# Patient Record
Sex: Female | Born: 1965
Health system: Southern US, Community
[De-identification: ages and names within clinical notes are randomized; demographics above are authoritative.]

## PROBLEM LIST (undated history)

## (undated) DIAGNOSIS — I10 Essential (primary) hypertension: Secondary | ICD-10-CM

## (undated) DIAGNOSIS — E785 Hyperlipidemia, unspecified: Secondary | ICD-10-CM

## (undated) DIAGNOSIS — T8859XA Other complications of anesthesia, initial encounter: Secondary | ICD-10-CM

## (undated) DIAGNOSIS — F329 Major depressive disorder, single episode, unspecified: Secondary | ICD-10-CM

## (undated) DIAGNOSIS — F419 Anxiety disorder, unspecified: Secondary | ICD-10-CM

## (undated) DIAGNOSIS — G4730 Sleep apnea, unspecified: Secondary | ICD-10-CM

## (undated) DIAGNOSIS — F32A Depression, unspecified: Secondary | ICD-10-CM

## (undated) HISTORY — PX: DILATION AND CURETTAGE OF UTERUS: SHX78

## (undated) HISTORY — PX: APPENDECTOMY: SHX54

## (undated) HISTORY — PX: CARPECTOMY HAND: SUR194

---

## 2004-02-24 ENCOUNTER — Ambulatory Visit: Payer: Self-pay | Admitting: Obstetrics and Gynecology

## 2004-02-27 ENCOUNTER — Ambulatory Visit: Payer: Self-pay | Admitting: Obstetrics and Gynecology

## 2004-03-12 ENCOUNTER — Ambulatory Visit: Payer: Self-pay | Admitting: Obstetrics and Gynecology

## 2004-03-20 ENCOUNTER — Ambulatory Visit: Payer: Self-pay | Admitting: Obstetrics and Gynecology

## 2004-03-28 ENCOUNTER — Ambulatory Visit: Payer: Self-pay | Admitting: Obstetrics and Gynecology

## 2004-03-30 ENCOUNTER — Ambulatory Visit: Payer: Self-pay | Admitting: Obstetrics and Gynecology

## 2004-04-02 ENCOUNTER — Ambulatory Visit: Payer: Self-pay | Admitting: Obstetrics and Gynecology

## 2004-07-04 ENCOUNTER — Emergency Department: Payer: Self-pay | Admitting: Emergency Medicine

## 2004-07-05 ENCOUNTER — Emergency Department: Payer: Self-pay | Admitting: Emergency Medicine

## 2005-05-06 ENCOUNTER — Ambulatory Visit: Payer: Self-pay | Admitting: Obstetrics and Gynecology

## 2006-06-12 ENCOUNTER — Ambulatory Visit: Payer: Self-pay | Admitting: Obstetrics and Gynecology

## 2006-06-18 ENCOUNTER — Ambulatory Visit: Payer: Self-pay | Admitting: Obstetrics and Gynecology

## 2006-10-27 ENCOUNTER — Ambulatory Visit: Payer: Self-pay | Admitting: Emergency Medicine

## 2007-06-11 ENCOUNTER — Ambulatory Visit: Payer: Self-pay | Admitting: Obstetrics and Gynecology

## 2007-06-11 ENCOUNTER — Ambulatory Visit: Payer: Self-pay | Admitting: Internal Medicine

## 2007-06-18 ENCOUNTER — Ambulatory Visit: Payer: Self-pay | Admitting: Obstetrics and Gynecology

## 2007-07-22 ENCOUNTER — Ambulatory Visit: Payer: Self-pay | Admitting: Internal Medicine

## 2007-07-28 ENCOUNTER — Ambulatory Visit: Payer: Self-pay | Admitting: Internal Medicine

## 2007-09-10 ENCOUNTER — Ambulatory Visit: Payer: Self-pay | Admitting: Internal Medicine

## 2007-09-28 ENCOUNTER — Ambulatory Visit: Payer: Self-pay | Admitting: Internal Medicine

## 2007-10-21 ENCOUNTER — Ambulatory Visit: Payer: Self-pay | Admitting: Allergy

## 2008-06-20 ENCOUNTER — Ambulatory Visit: Payer: Self-pay | Admitting: Obstetrics and Gynecology

## 2008-06-28 ENCOUNTER — Ambulatory Visit: Payer: Self-pay | Admitting: Obstetrics and Gynecology

## 2008-07-14 ENCOUNTER — Ambulatory Visit: Payer: Self-pay | Admitting: Internal Medicine

## 2008-08-05 ENCOUNTER — Other Ambulatory Visit: Payer: Self-pay | Admitting: Internal Medicine

## 2009-01-03 ENCOUNTER — Ambulatory Visit: Payer: Self-pay | Admitting: Obstetrics and Gynecology

## 2009-03-09 ENCOUNTER — Ambulatory Visit: Payer: Self-pay | Admitting: Internal Medicine

## 2009-03-09 ENCOUNTER — Ambulatory Visit: Payer: Self-pay | Admitting: Orthopedic Surgery

## 2009-03-14 ENCOUNTER — Ambulatory Visit: Payer: Self-pay | Admitting: Orthopedic Surgery

## 2009-03-29 ENCOUNTER — Ambulatory Visit: Payer: Self-pay | Admitting: Family Medicine

## 2009-06-28 ENCOUNTER — Ambulatory Visit: Payer: Self-pay | Admitting: Obstetrics and Gynecology

## 2009-10-03 ENCOUNTER — Other Ambulatory Visit: Payer: Self-pay | Admitting: Obstetrics and Gynecology

## 2010-09-24 ENCOUNTER — Ambulatory Visit: Payer: Self-pay | Admitting: Obstetrics and Gynecology

## 2011-09-25 ENCOUNTER — Ambulatory Visit: Payer: Self-pay | Admitting: Obstetrics and Gynecology

## 2016-10-23 ENCOUNTER — Ambulatory Visit
Admission: EM | Admit: 2016-10-23 | Discharge: 2016-10-23 | Disposition: A | Discharge status: Home / Self Care | Payer: Medicare Other | Attending: Family Medicine | Admitting: Family Medicine

## 2016-10-23 DIAGNOSIS — L03115 Cellulitis of right lower limb: Secondary | ICD-10-CM

## 2016-10-23 MED ORDER — CEFAZOLIN SODIUM 1 G IJ SOLR
1.0000 g | Freq: Once | INTRAMUSCULAR | Status: AC
  Administered 2016-10-23: 1 g via INTRAMUSCULAR

## 2016-10-23 MED ORDER — SULFAMETHOXAZOLE-TRIMETHOPRIM 800-160 MG PO TABS: 1.0000 | ORAL_TABLET | Freq: Two times a day (BID) | ORAL | 0 refills | Status: DC

## 2016-10-23 NOTE — ED Triage Notes (Signed)
Pt reports getting bit by unknown insect last week that caused itchy bump to right ankle. After several days the itching subsided. Monday pt was on feet all day and noticed increased swelling, redness, and drainage from site last night

## 2016-10-23 NOTE — ED Provider Notes (Addendum)
[facility]    CSN: [id number] Arrival date & time: 10/23/16  1558     History   Chief Complaint Chief Complaint  Patient presents with  . Wound Infection    HPI Alicia Rowe is a 51 y.o. female.   51 yo female with a c/o right foot redness, drainage, warmth and pain to to skin just above the ankle joint. States she thinks that she may have been bitten by an insect last week when she noticed a "little bump" on that area. Denies any fevers or chills.    The history is provided by the patient.    History reviewed. No pertinent past medical history.  There are no active problems to display for this patient.   Past Surgical History:  Procedure Laterality Date  . APPENDECTOMY    . CARPECTOMY HAND    . DILATION AND CURETTAGE OF UTERUS      OB History    No data available       Home Medications    Prior to Admission medications   Medication Sig Start Date End Date Taking? Authorizing Provider  sulfamethoxazole-trimethoprim (BACTRIM DS,SEPTRA DS) 800-160 MG tablet Take 1 tablet by mouth 2 (two) times daily. 10/23/16   Norval Gable, MD    Family History No family history on file.  Social History Social History  Substance Use Topics  . Smoking status: Current Every Day Smoker  . Smokeless tobacco: Never Used  . Alcohol use No     Allergies   Patient has no known allergies.   Review of Systems Review of Systems   Physical Exam Triage Vital Signs ED Triage Vitals  Enc Vitals Group     BP 10/23/16 1637 (!) 153/81     Pulse Rate 10/23/16 1637 76     Resp 10/23/16 1637 16     Temp 10/23/16 1637 98.5 F (36.9 C)     Temp Source 10/23/16 1637 Oral     SpO2 10/23/16 1637 99 %     Weight 10/23/16 1637 214 lb 15.2 oz (97.5 kg)     Height 10/23/16 1637 5\' 3"  (1.6 m)     Head Circumference --      Peak Flow --      Pain Score 10/23/16 1642 8     Pain Loc --      Pain Edu? --      Excl. in [place]? --    No data found.   Updated Vital  Signs BP (!) 153/81 (BP Location: Right Arm)   Pulse 76   Temp 98.5 F (36.9 C) (Oral)   Resp 16   Ht 5\' 3"  (1.6 m)   Wt 214 lb 15.2 oz (97.5 kg)   SpO2 99%   BMI 38.08 kg/m   Visual Acuity Right Eye Distance:   Left Eye Distance:   Bilateral Distance:    Right Eye Near:   Left Eye Near:    Bilateral Near:     Physical Exam  Constitutional: She appears well-developed and well-nourished. No distress.  Musculoskeletal:       Right ankle: She exhibits swelling. She exhibits normal range of motion, no ecchymosis, no deformity, no laceration and normal pulse. Achilles tendon normal.       Feet:  Blanchable erythema, edema, warmth and tenderness to palpation on the skin just above the lateral malleolus; pinpoint puncture wound noted  Skin: She is not diaphoretic. There is erythema.  Nursing note and vitals reviewed.  UC Treatments / Results  Labs (all labs ordered are listed, but only abnormal results are displayed) Labs Reviewed - No data to display  EKG  EKG Interpretation None       Radiology No results found.  Procedures Procedures (including critical care time)  Medications Ordered in UC Medications  ceFAZolin (ANCEF) injection 1 g (1 g Intramuscular Given 10/23/16 1723)     Initial Impression / Assessment and Plan / UC Course  I have reviewed the triage vital signs and the nursing notes.  Pertinent labs & imaging results that were available during my care of the patient were reviewed by me and considered in my medical decision making (see chart for details).       Final Clinical Impressions(s) / UC Diagnoses   Final diagnoses:  Cellulitis of right foot    New Prescriptions Discharge Medication List as of 10/23/2016  5:37 PM    START taking these medications   Details  sulfamethoxazole-trimethoprim (BACTRIM DS,SEPTRA DS) 800-160 MG tablet Take 1 tablet by mouth 2 (two) times daily., Starting Wed 10/23/2016, Normal       1. diagnosis  reviewed with patient  2. Patient given Ancef 1gm IM x1 3. rx as per orders above; reviewed possible side effects, interactions, risks and benefits  4. Recommend supportive treatment with elevation, warm compresses 5. Follow-up prn if symptoms worsen or don't improve  Controlled Substance Prescriptions [place] Controlled Substance Registry consulted? Not Applicable   Norval Gable, MD 10/23/16 [place], MD 10/23/16 [phone]

## 2016-12-12 DIAGNOSIS — Z23 Encounter for immunization: Secondary | ICD-10-CM | POA: Diagnosis not present

## 2017-01-12 ENCOUNTER — Ambulatory Visit
Admission: EM | Admit: 2017-01-12 | Discharge: 2017-01-12 | Disposition: A | Discharge status: Home / Self Care | Payer: Medicare Other | Attending: Family Medicine | Admitting: Family Medicine

## 2017-01-12 ENCOUNTER — Encounter: Payer: Self-pay | Admitting: *Deleted

## 2017-01-12 ENCOUNTER — Other Ambulatory Visit: Payer: Self-pay

## 2017-01-12 DIAGNOSIS — L02412 Cutaneous abscess of left axilla: Secondary | ICD-10-CM | POA: Diagnosis not present

## 2017-01-12 HISTORY — DX: Major depressive disorder, single episode, unspecified: F32.9

## 2017-01-12 HISTORY — DX: Depression, unspecified: F32.A

## 2017-01-12 HISTORY — DX: Anxiety disorder, unspecified: F41.9

## 2017-01-12 MED ORDER — DOXYCYCLINE HYCLATE 100 MG PO CAPS: 100.0000 mg | ORAL_CAPSULE | Freq: Two times a day (BID) | ORAL | 0 refills | Status: DC

## 2017-01-12 NOTE — ED Provider Notes (Signed)
[facility]    CSN: [id number] Arrival date & time: 01/12/17  1413  History   Chief Complaint Chief Complaint  Patient presents with  . Abscess   HPI  51 year old female presents with abscess.  Patient states that over the past 2 weeks she has had 2 abscesses in her left axilla.  She reports a prior history of frequent abscesses in the axilla.  She states that over the past 3-4 days, 1 of them has been more severe and worsening. Drained the pus yesterday.  She has been applying warm compresses without complete resolution.  No medications tried.  She states that she has had no fever but has had diarrhea and loss of appetite.  She states that the areas of concern are red and hard to the touch.  Tender.  No other associated symptoms.  No other complaints at this time.  Past Medical History:  Diagnosis Date  . Anxiety   . Depression    Past Surgical History:  Procedure Laterality Date  . APPENDECTOMY    . CARPECTOMY HAND    . DILATION AND CURETTAGE OF UTERUS     OB History    No data available     Home Medications    Prior to Admission medications   Medication Sig Start Date End Date Taking? Authorizing Provider  ALPRAZolam Duanne Moron) 1 MG tablet Take 1 mg by mouth at bedtime as needed for anxiety.   Yes [provider]  FLUoxetine (PROZAC) 20 MG tablet Take 20 mg by mouth daily.   Yes [provider]  naproxen sodium (ALEVE) 220 MG tablet Take 440 mg by mouth daily as needed.   Yes [provider]  doxycycline (VIBRAMYCIN) 100 MG capsule Take 1 capsule (100 mg total) by mouth 2 (two) times daily. 01/12/17   Coral Spikes, DO   Family History Family History  Problem Relation Age of Onset  . Dementia Mother    Social History Social History   Tobacco Use  . Smoking status: Current Every Day Smoker  . Smokeless tobacco: Never Used  Substance Use Topics  . Alcohol use: No  . Drug use: No    Allergies   Patient has no known  allergies.  Review of Systems Review of Systems  Constitutional: Positive for appetite change. Negative for fever.  Gastrointestinal: Positive for diarrhea.  Skin:       Abscess. Drainage. (Left axilla).   Physical Exam Triage Vital Signs ED Triage Vitals  Enc Vitals Group     BP 01/12/17 1532 (!) 153/57     Pulse Rate 01/12/17 1532 79     Resp 01/12/17 1532 16     Temp 01/12/17 1532 99 F (37.2 C)     Temp Source 01/12/17 1532 Oral     SpO2 01/12/17 1532 99 %     Weight 01/12/17 1534 204 lb (92.5 kg)     Height 01/12/17 1534 5\' 3"  (1.6 m)     Head Circumference --      Peak Flow --      Pain Score 01/12/17 1534 5     Pain Loc --      Pain Edu? --      Excl. in [place]? --    No data found.  Updated Vital Signs BP (!) 153/57 (BP Location: Left Arm)   Pulse 79   Temp 99 F (37.2 C) (Oral)   Resp 16   Ht 5\' 3"  (1.6 m)   Wt 204  lb (92.5 kg)   SpO2 99%   BMI 36.14 kg/m     Physical Exam  Constitutional: She is oriented to person, place, and time. She appears well-developed. No distress.  Cardiovascular: Normal rate and regular rhythm.  Pulmonary/Chest: Effort normal and breath sounds normal. She has no wheezes. She has no rales.  Neurological: She is alert and oriented to person, place, and time.  Skin:  Left axilla - 2 discrete abscess noted.  Superior abscess measures 3.5 cm x 7 mm.  Firm.  No fluctuance.  Inferior abscess measures 1.5 cm circumferentially.  Firm.  No fluctuance.  Both areas are erythematous.  Superior abscess tender to palpation.  Psychiatric: She has a normal mood and affect. Her behavior is normal.  Nursing note and vitals reviewed.  UC Treatments / Results  Labs (all labs ordered are listed, but only abnormal results are displayed) Labs Reviewed - No data to display  EKG  EKG Interpretation None       Radiology No results found.  Procedures Procedures (including critical care time)  Medications Ordered in UC Medications - No data  to display   Initial Impression / Assessment and Plan / UC Course  I have reviewed the triage vital signs and the nursing notes.  Pertinent labs & imaging results that were available during my care of the patient were reviewed by me and considered in my medical decision making (see chart for details).     51 year old female presents with abscesses in the axilla.  I did not feel that either were amenable to drainage.  Placing on doxycycline.  Final Clinical Impressions(s) / UC Diagnoses   Final diagnoses:  Abscess of left axilla    ED Discharge Orders        Ordered    doxycycline (VIBRAMYCIN) 100 MG capsule  2 times daily     01/12/17 1624     Controlled Substance Prescriptions [place] Controlled Substance Registry consulted? Not Applicable   Coral Spikes, DO 01/12/17 1718

## 2017-01-12 NOTE — Discharge Instructions (Signed)
Antibiotic as prescribed.  Take care  Dr. Gae Bihl  

## 2017-01-12 NOTE — ED Triage Notes (Signed)
Patient started having symptoms of diarrhea, loss of appetite, and abscess of the left armpit 3 days ago. Patient has a history of armpit abscess.

## 2017-03-02 ENCOUNTER — Other Ambulatory Visit: Payer: Self-pay | Admitting: Family Medicine

## 2017-03-27 ENCOUNTER — Ambulatory Visit
Admission: EM | Admit: 2017-03-27 | Discharge: 2017-03-27 | Disposition: A | Discharge status: Home / Self Care | Payer: Medicare HMO | Attending: Family Medicine | Admitting: Family Medicine

## 2017-03-27 ENCOUNTER — Encounter: Payer: Self-pay | Admitting: *Deleted

## 2017-03-27 DIAGNOSIS — J3489 Other specified disorders of nose and nasal sinuses: Secondary | ICD-10-CM

## 2017-03-27 DIAGNOSIS — J0100 Acute maxillary sinusitis, unspecified: Secondary | ICD-10-CM

## 2017-03-27 MED ORDER — MUPIROCIN 2 % EX OINT: 1.0000 "application " | TOPICAL_OINTMENT | Freq: Two times a day (BID) | CUTANEOUS | 0 refills | Status: AC

## 2017-03-27 MED ORDER — DOXYCYCLINE HYCLATE 100 MG PO CAPS: 100.0000 mg | ORAL_CAPSULE | Freq: Two times a day (BID) | ORAL | 0 refills | Status: DC

## 2017-03-27 NOTE — ED Provider Notes (Signed)
[facility]    CSN: [id number] Arrival date & time: 03/27/17  1139  History   Chief Complaint Chief Complaint  Patient presents with  . Headache  . Facial Pain  . Facial Swelling   HPI  52 year old female presents with the above complaints.  Patient reports a 3-week history of facial pain, congestion, and sinus pressure.  Sinus pain and pressure is predominantly located on the left side, maxillary region as well as around the eye.  Severe.  She has had some subjective fever recently.  She states that her symptoms have been worsening and have not improved with over-the-counter Flonase as well as over-the-counter cold medications.  Additionally, patient reports swelling of the left nostril and left side of the nose.  No known exacerbating factors.  No other associated symptoms.  No other complaints at this time.  Past Medical History:  Diagnosis Date  . Anxiety   . Depression    Past Surgical History:  Procedure Laterality Date  . APPENDECTOMY    . CARPECTOMY HAND    . DILATION AND CURETTAGE OF UTERUS      OB History    No data available     Home Medications    Prior to Admission medications   Medication Sig Start Date End Date Taking? Authorizing Provider  ALPRAZolam Duanne Moron) 1 MG tablet Take 1 mg by mouth at bedtime as needed for anxiety.   Yes [provider]  FLUoxetine (PROZAC) 20 MG tablet Take 20 mg by mouth daily.   Yes [provider]  naproxen sodium (ALEVE) 220 MG tablet Take 440 mg by mouth daily as needed.   Yes [provider]  doxycycline (VIBRAMYCIN) 100 MG capsule Take 1 capsule (100 mg total) by mouth 2 (two) times daily. 03/27/17   Coral Spikes, DO  mupirocin ointment (BACTROBAN) 2 % Apply 1 application topically 2 (two) times daily for 7 days. 03/27/17 04/03/17  Coral Spikes, DO    Family History Family History  Problem Relation Age of Onset  . Dementia Mother   . Diabetes Father     Social History Social  History   Tobacco Use  . Smoking status: Current Every Day Smoker  . Smokeless tobacco: Never Used  Substance Use Topics  . Alcohol use: No  . Drug use: No     Allergies   Patient has no known allergies.   Review of Systems Review of Systems  Constitutional: Positive for fever.  HENT: Positive for congestion, sinus pressure and sinus pain.    Physical Exam Triage Vital Signs ED Triage Vitals  Enc Vitals Group     BP 03/27/17 1159 (!) 153/86     Pulse Rate 03/27/17 1159 85     Resp 03/27/17 1159 16     Temp 03/27/17 1159 98.6 F (37 C)     Temp Source 03/27/17 1159 Oral     SpO2 03/27/17 1159 98 %     Weight 03/27/17 1204 205 lb (93 kg)     Height 03/27/17 1204 5\' 3"  (1.6 m)     Head Circumference --      Peak Flow --      Pain Score 03/27/17 1203 6     Pain Loc --      Pain Edu? --      Excl. in [place]? --    Updated Vital Signs BP (!) 153/86 (BP Location: Left Arm)   Pulse 85   Temp 98.6 F (37 C) (Oral)  Resp 16   Ht 5\' 3"  (1.6 m)   Wt 205 lb (93 kg)   SpO2 98%   BMI 36.31 kg/m   Visual Acuity Right Eye Distance: 20/30 Left Eye Distance: 20/25 Bilateral Distance: 20/20  Right Eye Near:   Left Eye Near:    Bilateral Near:     Physical Exam  Constitutional: She is oriented to person, place, and time. She appears well-developed and well-nourished. No distress.  HENT:  Head: Normocephalic and atraumatic.  Left maxillary sinus tenderness to palpation.  Erythema and mild swelling noted around the left nasal vestibule and left nostril.  Eyes: Conjunctivae are normal. Right eye exhibits no discharge. Left eye exhibits no discharge.  Cardiovascular: Normal rate and regular rhythm.  Pulmonary/Chest: Effort normal and breath sounds normal. She has no wheezes. She has no rales.  Neurological: She is alert and oriented to person, place, and time.  Psychiatric: She has a normal mood and affect. Her behavior is normal.  Nursing note and vitals reviewed.  UC  Treatments / Results  Labs (all labs ordered are listed, but only abnormal results are displayed) Labs Reviewed - No data to display  EKG  EKG Interpretation None       Radiology No results found.  Procedures Procedures (including critical care time)  Medications Ordered in UC Medications - No data to display   Initial Impression / Assessment and Plan / UC Course  I have reviewed the triage vital signs and the nursing notes.  Pertinent labs & imaging results that were available during my care of the patient were reviewed by me and considered in my medical decision making (see chart for details).     52 year old female presents with sinusitis and nasal vestibulitis.  Treating with Doxy and Bactrim.  Final Clinical Impressions(s) / UC Diagnoses   Final diagnoses:  Acute maxillary sinusitis, recurrence not specified  Nasal vestibulitis    ED Discharge Orders        Ordered    doxycycline (VIBRAMYCIN) 100 MG capsule  2 times daily     03/27/17 1238    mupirocin ointment (BACTROBAN) 2 %  2 times daily     03/27/17 1238     Controlled Substance Prescriptions [place] Controlled Substance Registry consulted? Not Applicable   Coral Spikes, DO 03/27/17 1321

## 2017-03-27 NOTE — Discharge Instructions (Signed)
Meds as prescribed.  Take care and I hope you feel better  Dr. Lacinda Axon

## 2017-03-27 NOTE — ED Triage Notes (Signed)
Headache, facial pain and edema, nasal pain and edema since Sunday.

## 2017-06-16 ENCOUNTER — Other Ambulatory Visit: Payer: Self-pay | Admitting: Obstetrics & Gynecology

## 2017-06-16 DIAGNOSIS — N6310 Unspecified lump in the right breast, unspecified quadrant: Principal | ICD-10-CM

## 2017-06-16 DIAGNOSIS — N6315 Unspecified lump in the right breast, overlapping quadrants: Secondary | ICD-10-CM

## 2017-06-19 ENCOUNTER — Other Ambulatory Visit: Payer: Self-pay | Admitting: Internal Medicine

## 2017-06-19 DIAGNOSIS — R945 Abnormal results of liver function studies: Secondary | ICD-10-CM

## 2017-06-23 ENCOUNTER — Ambulatory Visit
Admission: RE | Admit: 2017-06-23 | Discharge: 2017-06-23 | Disposition: A | Discharge status: Home / Self Care | Payer: Medicare HMO | Source: Ambulatory Visit | Attending: Obstetrics & Gynecology | Admitting: Obstetrics & Gynecology

## 2017-06-23 DIAGNOSIS — N6310 Unspecified lump in the right breast, unspecified quadrant: Secondary | ICD-10-CM | POA: Insufficient documentation

## 2017-06-23 DIAGNOSIS — N6315 Unspecified lump in the right breast, overlapping quadrants: Secondary | ICD-10-CM

## 2017-06-24 ENCOUNTER — Other Ambulatory Visit: Admit: 2017-06-24 | Payer: Medicare Other

## 2017-06-24 ENCOUNTER — Ambulatory Visit
Admission: RE | Admit: 2017-06-24 | Discharge: 2017-06-24 | Disposition: A | Discharge status: Home / Self Care | Payer: Medicare HMO | Source: Ambulatory Visit | Attending: Internal Medicine | Admitting: Internal Medicine

## 2017-06-24 DIAGNOSIS — R945 Abnormal results of liver function studies: Secondary | ICD-10-CM | POA: Diagnosis not present

## 2017-07-03 ENCOUNTER — Encounter: Payer: Self-pay | Admitting: *Deleted

## 2019-01-13 IMAGING — US US ABDOMEN COMPLETE
1 series · 13 of 25 positions shown · non-contrast
Comparison: None.

CLINICAL DATA: 51-year-old female with abnormal liver function
studies. Initial encounter.

EXAM:
ABDOMEN ULTRASOUND COMPLETE

[Series 1: us abdomen complete · 0.23mm/px · 13 of 94 slices shown]
[im 1/94]
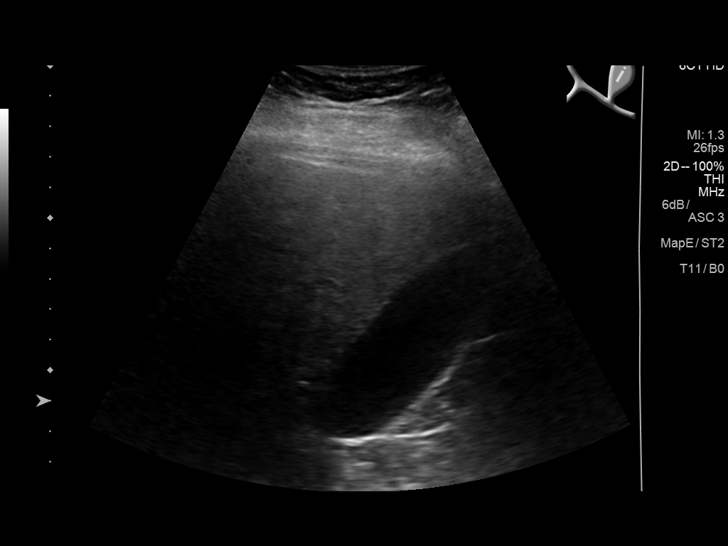
[im 8/94]
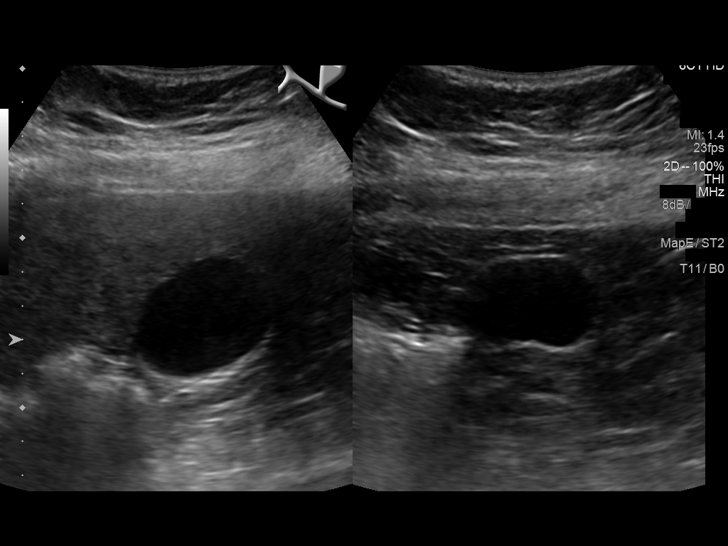
[im 16/94]
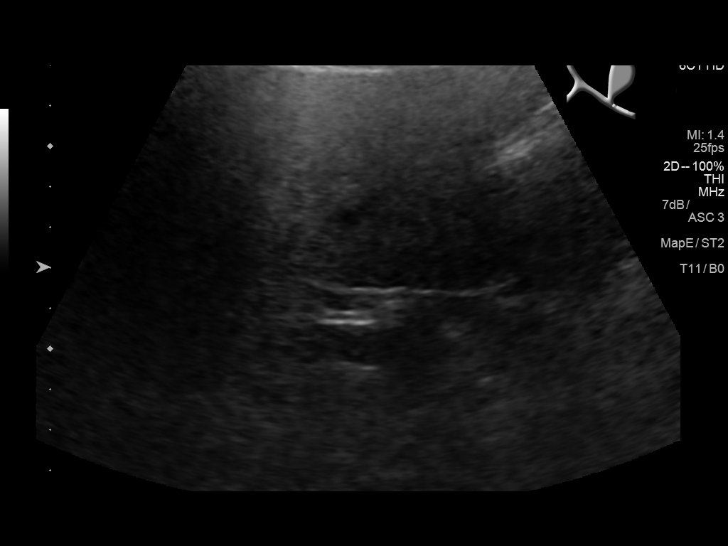
[im 24/94]
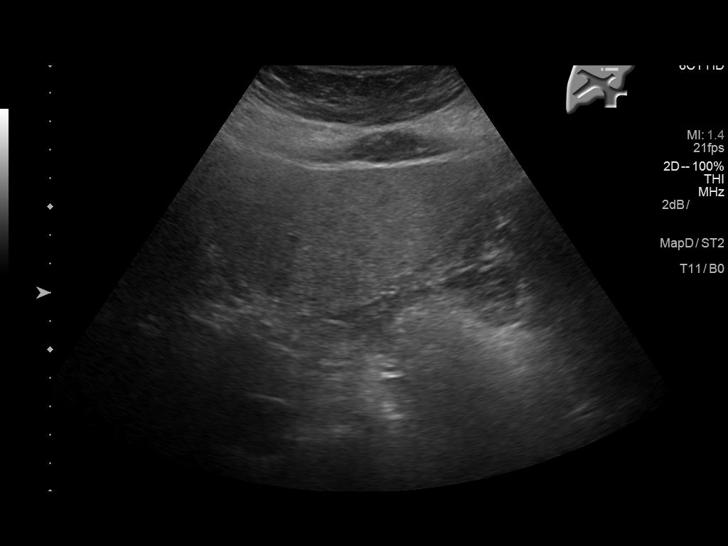
[im 32/94]
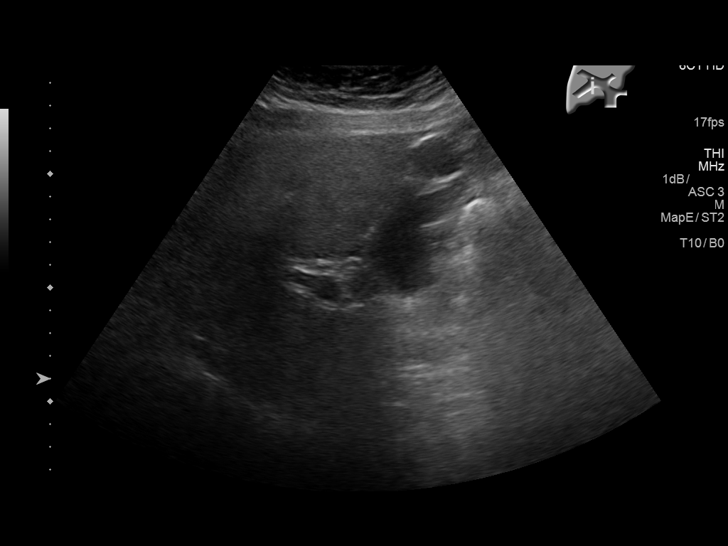
[im 39/94]
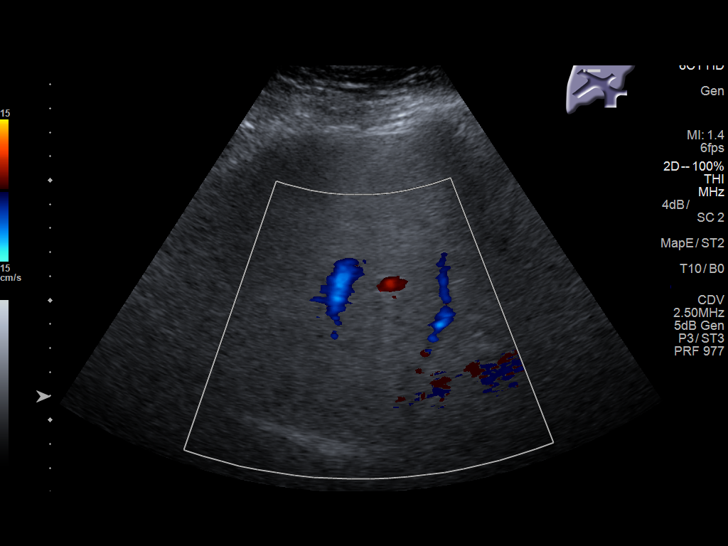
[im 47/94]
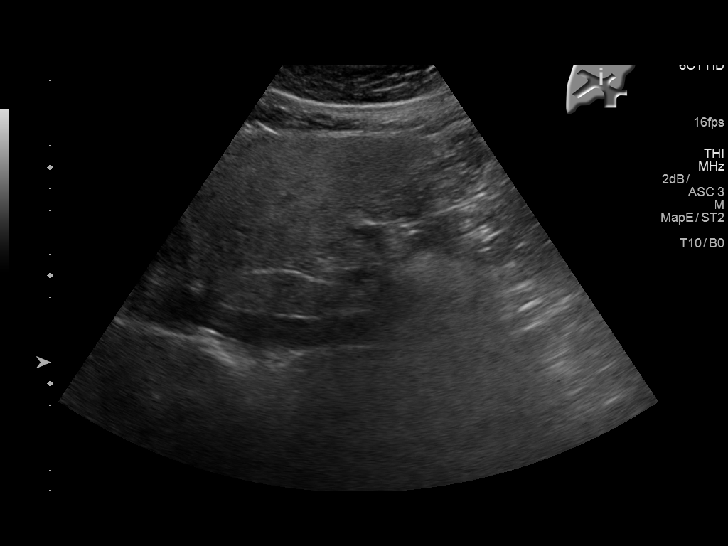
[im 55/94]
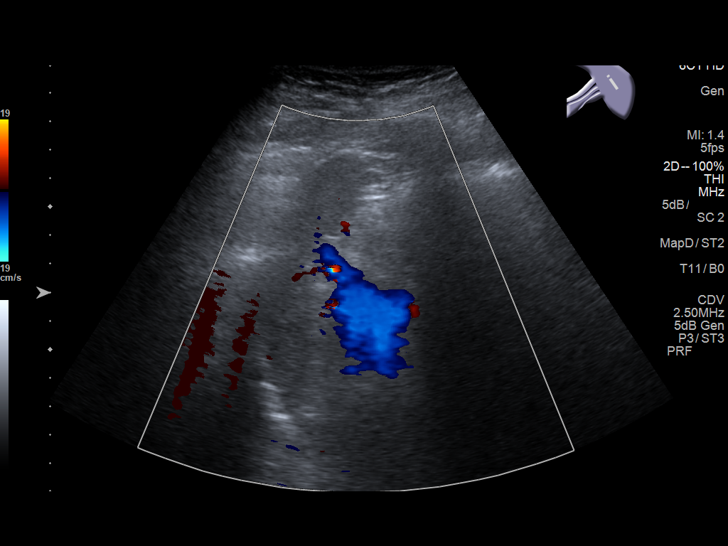
[im 63/94]
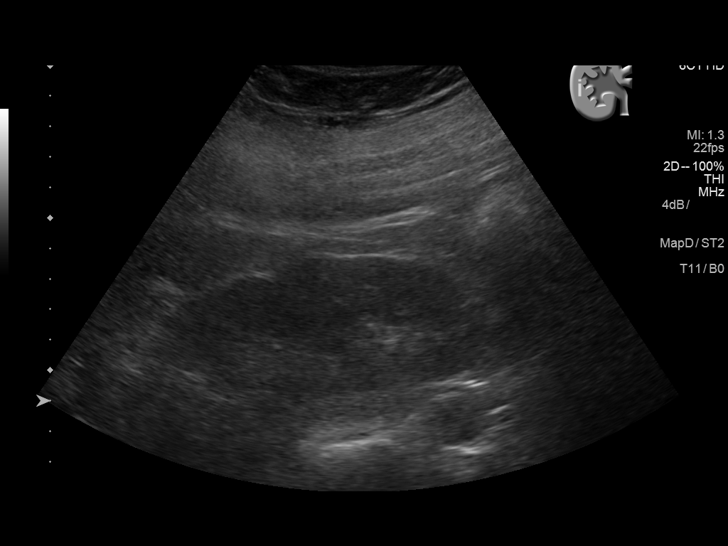
[im 70/94]
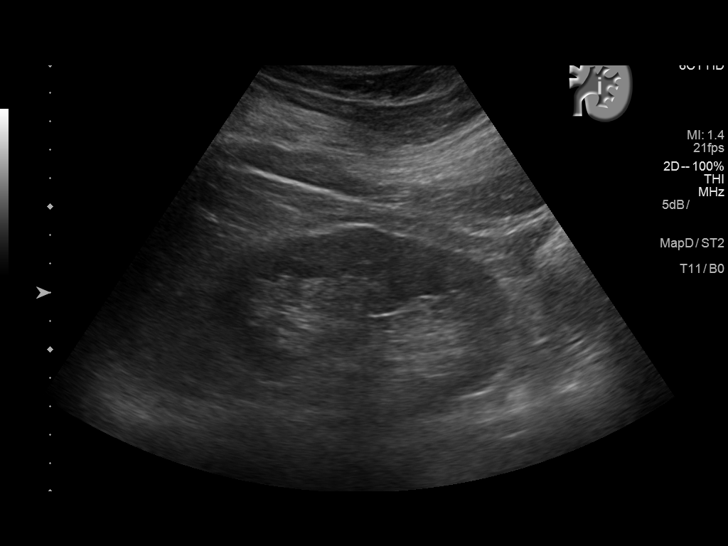
[im 78/94]
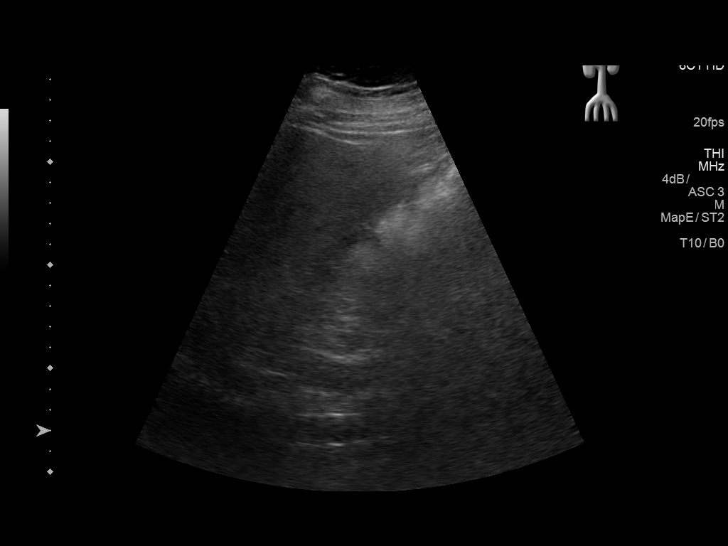
[im 86/94]
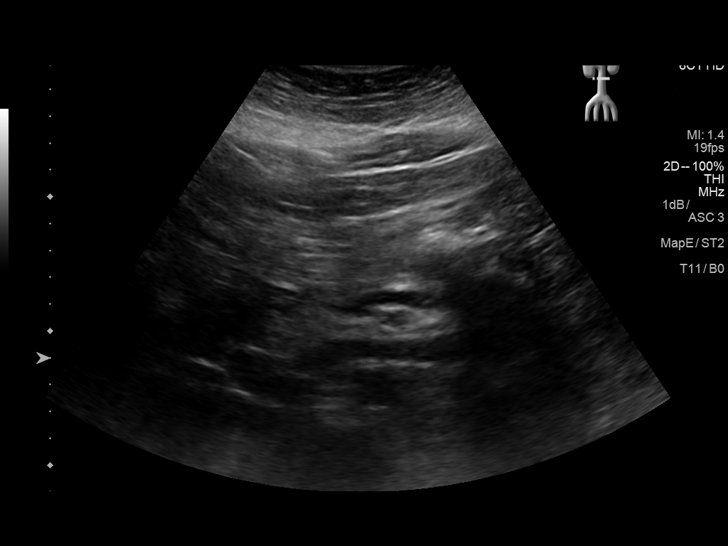
[im 94/94]
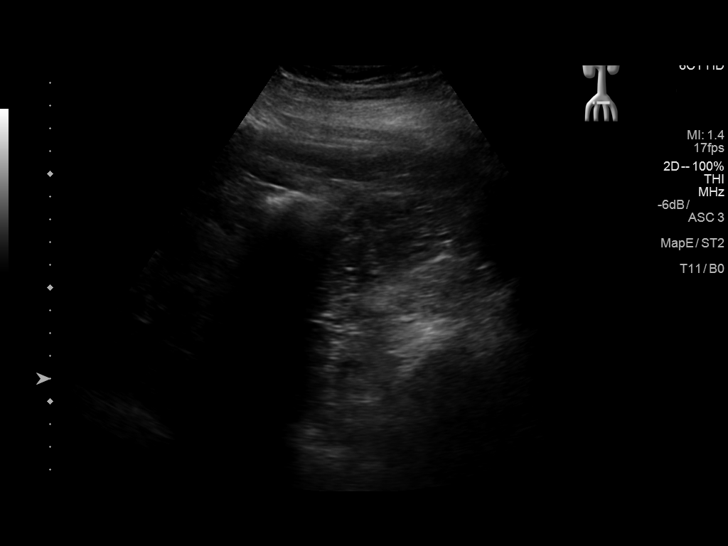

[13 of 25 positions shown; findings below may reference images not displayed]

FINDINGS: Gallbladder: No gallstones or wall thickening visualized. No
sonographic Murphy sign noted by sonographer.

Common bile duct: Diameter: 4.6 mm

Liver: Liver of increased echogenicity consistent with fatty
infiltration and/or hepatocellular disease. Difficult to penetrate
the liver. No focal mass noted. Portal vein is patent on color
Doppler imaging with normal direction of blood flow towards the
liver.

IVC: Suboptimally evaluated secondary to habitus and bowel gas.
Portions visualized unremarkable.

Pancreas: Suboptimally evaluated secondary to habitus and bowel gas.
Portions visualized unremarkable.

Spleen: Size and appearance within normal limits.

Right Kidney: Length: 10.5 cm. Echogenicity within normal limits. No
mass or hydronephrosis visualized.

Left Kidney: Length: 10.3 cm. Echogenicity within normal limits. No
mass or hydronephrosis visualized.

Abdominal aorta: Suboptimally evaluated secondary to habitus and
bowel gas. Portions visualized unremarkable.

Other findings: None.
IMPRESSION: Liver of increased echogenicity consistent with fatty infiltration
and/or hepatocellular disease. It was difficult to penetrate the
liver. No focal hepatic lesion noted.

Suboptimal evaluation of the inferior vena cava, pancreas and aorta
secondary to bowel gas. The portions which are visualized are
unremarkable.

Otherwise negative abdominal sonogram. Specifically, gallbladder is
within normal limits.

## 2019-01-21 ENCOUNTER — Other Ambulatory Visit: Payer: Self-pay | Admitting: Internal Medicine

## 2019-01-21 DIAGNOSIS — Z1231 Encounter for screening mammogram for malignant neoplasm of breast: Secondary | ICD-10-CM

## 2019-01-27 ENCOUNTER — Telehealth: Payer: Self-pay

## 2019-01-27 ENCOUNTER — Other Ambulatory Visit: Payer: Self-pay

## 2019-01-27 DIAGNOSIS — Z1211 Encounter for screening for malignant neoplasm of colon: Secondary | ICD-10-CM

## 2019-01-27 NOTE — Telephone Encounter (Signed)
Gastroenterology Pre-Procedure Review  Request Date: Thursday 02/18/19 Requesting Physician: Dr. Allen Norris  PATIENT REVIEW QUESTIONS: The patient responded to the following health history questions as indicated:    1. Are you having any GI issues? no 2. Do you have a personal history of Polyps? no 3. Do you have a family history of Colon Cancer or Polyps? no 4. Diabetes Mellitus? no 5. Joint replacements in the past 12 months?no 6. Major health problems in the past 3 months?no 7. Any artificial heart valves, MVP, or defibrillator?no    MEDICATIONS & ALLERGIES:    Patient reports the following regarding taking any anticoagulation/antiplatelet therapy:   Plavix, Coumadin, Eliquis, Xarelto, Lovenox, Pradaxa, Brilinta, or Effient? no Aspirin? no  Patient confirms/reports the following medications:  Current Outpatient Medications  Medication Sig Dispense Refill  . ALPRAZolam (XANAX) 1 MG tablet Take 1 mg by mouth at bedtime as needed for anxiety.    Marland Kitchen doxycycline (VIBRAMYCIN) 100 MG capsule Take 1 capsule (100 mg total) by mouth 2 (two) times daily. 14 capsule 0  . FLUoxetine (PROZAC) 20 MG tablet Take 20 mg by mouth daily.    . naproxen sodium (ALEVE) 220 MG tablet Take 440 mg by mouth daily as needed.     No current facility-administered medications for this visit.    Patient confirms/reports the following allergies:  Allergies  Allergen Reactions  . Codeine Hives  . Midazolam Other (See Comments)    "reverse effect" pt states that she "woke up" when given "reverse effect" pt states that she "woke up" when given   . Scopolamine     Other reaction(s): Hallucination    No orders of the defined types were placed in this encounter.   AUTHORIZATION INFORMATION Primary Insurance: 1D#: Group #:  Secondary Insurance: 1D#: Group #:  SCHEDULE INFORMATION: Date: 02/18/19 Time: Location:[facility]

## 2019-02-16 ENCOUNTER — Other Ambulatory Visit
Admission: RE | Admit: 2019-02-16 | Discharge: 2019-02-16 | Disposition: A | Discharge status: Home / Self Care | Payer: Medicare HMO | Source: Ambulatory Visit | Attending: Gastroenterology | Admitting: Gastroenterology

## 2019-02-16 ENCOUNTER — Other Ambulatory Visit: Payer: Self-pay

## 2019-02-16 DIAGNOSIS — Z20822 Contact with and (suspected) exposure to covid-19: Secondary | ICD-10-CM | POA: Insufficient documentation

## 2019-02-16 DIAGNOSIS — Z01812 Encounter for preprocedural laboratory examination: Secondary | ICD-10-CM | POA: Diagnosis present

## 2019-02-17 LAB — SARS CORONAVIRUS 2 (TAT 6-24 HRS): SARS Coronavirus 2: NEGATIVE

## 2019-02-18 ENCOUNTER — Ambulatory Visit: Payer: Medicare HMO | Admitting: Certified Registered"

## 2019-02-18 ENCOUNTER — Encounter: Payer: Self-pay | Admitting: Gastroenterology

## 2019-02-18 ENCOUNTER — Encounter
Admission: RE | Disposition: A | Discharge status: Home / Self Care | Payer: Self-pay | Source: Ambulatory Visit | Attending: Gastroenterology

## 2019-02-18 ENCOUNTER — Ambulatory Visit
Admission: RE | Admit: 2019-02-18 | Discharge: 2019-02-18 | Disposition: A | Discharge status: Home / Self Care | Payer: Medicare HMO | Source: Ambulatory Visit | Attending: Gastroenterology | Admitting: Gastroenterology

## 2019-02-18 DIAGNOSIS — K640 First degree hemorrhoids: Secondary | ICD-10-CM | POA: Diagnosis not present

## 2019-02-18 DIAGNOSIS — Z6831 Body mass index (BMI) 31.0-31.9, adult: Secondary | ICD-10-CM | POA: Diagnosis not present

## 2019-02-18 DIAGNOSIS — G4730 Sleep apnea, unspecified: Secondary | ICD-10-CM | POA: Insufficient documentation

## 2019-02-18 DIAGNOSIS — F17200 Nicotine dependence, unspecified, uncomplicated: Secondary | ICD-10-CM | POA: Insufficient documentation

## 2019-02-18 DIAGNOSIS — Z888 Allergy status to other drugs, medicaments and biological substances status: Secondary | ICD-10-CM | POA: Diagnosis not present

## 2019-02-18 DIAGNOSIS — Z79899 Other long term (current) drug therapy: Secondary | ICD-10-CM | POA: Insufficient documentation

## 2019-02-18 DIAGNOSIS — Z885 Allergy status to narcotic agent status: Secondary | ICD-10-CM | POA: Insufficient documentation

## 2019-02-18 DIAGNOSIS — F329 Major depressive disorder, single episode, unspecified: Secondary | ICD-10-CM | POA: Diagnosis not present

## 2019-02-18 DIAGNOSIS — F419 Anxiety disorder, unspecified: Secondary | ICD-10-CM | POA: Diagnosis not present

## 2019-02-18 DIAGNOSIS — Z1211 Encounter for screening for malignant neoplasm of colon: Secondary | ICD-10-CM | POA: Diagnosis present

## 2019-02-18 DIAGNOSIS — D125 Benign neoplasm of sigmoid colon: Secondary | ICD-10-CM | POA: Insufficient documentation

## 2019-02-18 DIAGNOSIS — K5730 Diverticulosis of large intestine without perforation or abscess without bleeding: Secondary | ICD-10-CM | POA: Insufficient documentation

## 2019-02-18 DIAGNOSIS — K635 Polyp of colon: Secondary | ICD-10-CM | POA: Diagnosis not present

## 2019-02-18 HISTORY — PX: COLONOSCOPY WITH PROPOFOL: SHX5780

## 2019-02-18 HISTORY — DX: Sleep apnea, unspecified: G47.30

## 2019-02-18 SURGERY — COLONOSCOPY WITH PROPOFOL
Anesthesia: General

## 2019-02-18 MED ORDER — LIDOCAINE 2% (20 MG/ML) 5 ML SYRINGE
INTRAMUSCULAR | Status: DC | PRN
  Administered 2019-02-18: 25 mg via INTRAVENOUS

## 2019-02-18 MED ORDER — MIDAZOLAM HCL 2 MG/2ML IJ SOLN
INTRAMUSCULAR | Status: AC
  Filled 2019-02-18: qty 2

## 2019-02-18 MED ORDER — PROPOFOL 10 MG/ML IV BOLUS
INTRAVENOUS | Status: DC | PRN
  Administered 2019-02-18: 100 mg via INTRAVENOUS

## 2019-02-18 MED ORDER — PROPOFOL 500 MG/50ML IV EMUL
INTRAVENOUS | Status: DC | PRN
  Administered 2019-02-18: 120 ug/kg/min via INTRAVENOUS

## 2019-02-18 MED ORDER — SODIUM CHLORIDE 0.9 % IV SOLN: INTRAVENOUS | Status: DC

## 2019-02-18 NOTE — Transfer of Care (Signed)
Immediate Anesthesia Transfer of Care Note  Patient: Alicia Rowe  Procedure(s) Performed: COLONOSCOPY WITH PROPOFOL (N/A )  Patient Location: Endoscopy Unit  Anesthesia Type:General  Level of Consciousness: awake and alert   Airway & Oxygen Therapy: Patient Spontanous Breathing  Post-op Assessment: Report given to RN and Post -op Vital signs reviewed and stable  Post vital signs: Reviewed  Last Vitals:  Vitals Value Taken Time  BP 115/98 02/18/19 1329  Temp 36.2 C 02/18/19 1329  Pulse 99 02/18/19 1329  Resp 17 02/18/19 1329  SpO2 94 % 02/18/19 1329  Vitals shown include unvalidated device data.  Last Pain:  Vitals:   02/18/19 1229  TempSrc: Temporal  PainSc: 0-No pain         Complications: No apparent anesthesia complications

## 2019-02-18 NOTE — Anesthesia Preprocedure Evaluation (Signed)
Anesthesia Evaluation  Patient identified by MRN, date of birth, ID band Patient awake    Reviewed: Allergy & Precautions, H&P , NPO status , reviewed documented beta blocker date and time   Airway Mallampati: II  TM Distance: >3 FB Neck ROM: full    Dental  (+) Chipped   Pulmonary sleep apnea , Current Smoker and Patient abstained from smoking.,  Counseled re TIVA and SA   Pulmonary exam normal        Cardiovascular Normal cardiovascular exam     Neuro/Psych PSYCHIATRIC DISORDERS Anxiety Depression    GI/Hepatic   Endo/Other  Morbid obesity  Renal/GU      Musculoskeletal  (+) Arthritis ,   Abdominal   Peds  Hematology   Anesthesia Other Findings Past Medical History: No date: Anxiety No date: Depression No date: Sleep apnea  Past Surgical History: No date: APPENDECTOMY No date: CARPECTOMY HAND No date: DILATION AND CURETTAGE OF UTERUS  BMI    Body Mass Index: 31.53 kg/m      Reproductive/Obstetrics                             Anesthesia Physical Anesthesia Plan  ASA: II  Anesthesia Plan: General   Post-op Pain Management:    Induction: Intravenous  PONV Risk Score and Plan: Treatment may vary due to age or medical condition and TIVA  Airway Management Planned: Nasal Cannula and Natural Airway  Additional Equipment:   Intra-op Plan:   Post-operative Plan:   Informed Consent: I have reviewed the patients History and Physical, chart, labs and discussed the procedure including the risks, benefits and alternatives for the proposed anesthesia with the patient or authorized representative who has indicated his/her understanding and acceptance.     Dental Advisory Given  Plan Discussed with: CRNA  Anesthesia Plan Comments:         Anesthesia Quick Evaluation

## 2019-02-18 NOTE — H&P (Signed)
Lucilla Lame, MD [facility] [address] [place], [place] [address] Phone: [phone] Fax : [phone]  Primary Care Physician:  Idelle Crouch, MD Primary Gastroenterologist:  Dr. Allen Norris  Pre-Procedure History & Physical: HPI:  Alicia Rowe is a 54 y.o. female is here for a screening colonoscopy.   Past Medical History:  Diagnosis Date  . Anxiety   . Depression   . Sleep apnea     Past Surgical History:  Procedure Laterality Date  . APPENDECTOMY    . CARPECTOMY HAND    . DILATION AND CURETTAGE OF UTERUS      Prior to Admission medications   Medication Sig Start Date End Date Taking? Authorizing Provider  ALPRAZolam Duanne Moron) 1 MG tablet Take 1 mg by mouth at bedtime as needed for anxiety.   Yes [provider]  doxycycline (VIBRAMYCIN) 100 MG capsule Take 1 capsule (100 mg total) by mouth 2 (two) times daily. Patient not taking: Reported on 02/18/2019 03/27/17   Coral Spikes, DO  FLUoxetine (PROZAC) 20 MG tablet Take 20 mg by mouth daily.    [provider]  naproxen sodium (ALEVE) 220 MG tablet Take 440 mg by mouth daily as needed.    [provider]    Allergies as of 01/27/2019 - Review Complete 03/27/2017  Allergen Reaction Noted  . Codeine Hives 08/07/2015  . Midazolam Other (See Comments) 08/07/2015  . Scopolamine  06/06/2017    Family History  Problem Relation Age of Onset  . Dementia Mother   . Diabetes Father   . Breast cancer Maternal Grandfather 32  . Breast cancer Paternal Grandmother 83    Social History   Socioeconomic History  . Marital status: Single    Spouse name: Not on file  . Number of children: Not on file  . Years of education: Not on file  . Highest education level: Not on file  Occupational History  . Not on file  Tobacco Use  . Smoking status: Current Every Day Smoker  . Smokeless tobacco: Never Used  Substance and Sexual Activity  . Alcohol use: No  . Drug use: No  . Sexual activity:  Not on file  Other Topics Concern  . Not on file  Social History Narrative  . Not on file   Social Determinants of Health   Financial Resource Strain:   . Difficulty of Paying Living Expenses: Not on file  Food Insecurity:   . Worried About Charity fundraiser in the Last Year: Not on file  . Ran Out of Food in the Last Year: Not on file  Transportation Needs:   . Lack of Transportation (Medical): Not on file  . Lack of Transportation (Non-Medical): Not on file  Physical Activity:   . Days of Exercise per Week: Not on file  . Minutes of Exercise per Session: Not on file  Stress:   . Feeling of Stress : Not on file  Social Connections:   . Frequency of Communication with Friends and Family: Not on file  . Frequency of Social Gatherings with Friends and Family: Not on file  . Attends Religious Services: Not on file  . Active Member of Clubs or Organizations: Not on file  . Attends Archivist Meetings: Not on file  . Marital Status: Not on file  Intimate Partner Violence:   . Fear of Current or Ex-Partner: Not on file  . Emotionally Abused: Not on file  . Physically Abused: Not on file  .  Sexually Abused: Not on file    Review of Systems: See HPI, otherwise negative ROS  Physical Exam: BP 126/79   Pulse (!) 123   Temp (!) 96.9 F (36.1 C) (Temporal)   Resp 18   Ht 5\' 3"  (1.6 m)   Wt 80.7 kg   SpO2 96%   BMI 31.53 kg/m  General:   Alert,  pleasant and cooperative in NAD Head:  Normocephalic and atraumatic. Neck:  Supple; no masses or thyromegaly. Lungs:  Clear throughout to auscultation.    Heart:  Regular rate and rhythm. Abdomen:  Soft, nontender and nondistended. Normal bowel sounds, without guarding, and without rebound.   Neurologic:  Alert and  oriented x4;  grossly normal neurologically.  Impression/Plan: Alicia Rowe is now here to undergo a screening colonoscopy.  Risks, benefits, and alternatives regarding colonoscopy have been reviewed  with the patient.  Questions have been answered.  All parties agreeable.

## 2019-02-18 NOTE — Op Note (Signed)
[facility] Patient Name: Alicia Rowe Procedure Date: 02/18/2019 1:01 PM MRN: [id number] Account #: [id number] Date of Birth: 1965/06/14 Admit Type: Outpatient Age: 54 Room: [facility] ENDO ROOM 1 Gender: Female Note Status: Finalized Procedure:             Colonoscopy Indications:           Screening for colorectal malignant neoplasm Providers:             Lucilla Lame MD, MD Referring MD:          Leonie Douglas. Doy Hutching, MD (Referring MD) Medicines:             Propofol per Anesthesia Complications:         No immediate complications. Procedure:             Pre-Anesthesia Assessment:                        - Prior to the procedure, a History and Physical was                         performed, and patient medications and allergies were                         reviewed. The patient's tolerance of previous                         anesthesia was also reviewed. The risks and benefits                         of the procedure and the sedation options and risks                         were discussed with the patient. All questions were                         answered, and informed consent was obtained. Prior                         Anticoagulants: The patient has taken no previous                         anticoagulant or antiplatelet agents. ASA Grade                         Assessment: II - A patient with mild systemic disease.                         After reviewing the risks and benefits, the patient                         was deemed in satisfactory condition to undergo the                         procedure.                        After obtaining informed consent, the colonoscope was  passed under direct vision. Throughout the procedure,                         the patient's blood pressure, pulse, and oxygen                         saturations were monitored continuously. The                         Colonoscope was introduced through the  anus and                         advanced to the the cecum, identified by appendiceal                         orifice and ileocecal valve. The colonoscopy was                         performed without difficulty. The patient tolerated                         the procedure well. The quality of the bowel                         preparation was excellent. Findings:      The perianal and digital rectal examinations were normal.      A 4 mm polyp was found in the sigmoid colon. The polyp was sessile. The       polyp was removed with a cold snare. Resection and retrieval were       complete.      Scattered small-mouthed diverticula were found in the entire colon.      Non-bleeding internal hemorrhoids were found during retroflexion. The       hemorrhoids were Grade I (internal hemorrhoids that do not prolapse). Impression:            - One 4 mm polyp in the sigmoid colon, removed with a                         cold snare. Resected and retrieved.                        - Diverticulosis in the entire examined colon.                        - Non-bleeding internal hemorrhoids. Recommendation:        - Discharge patient to home.                        - Resume previous diet.                        - Continue present medications.                        - Await pathology results.                        - Repeat colonoscopy in 5 years if polyp adenoma and  10 years if hyperplastic Procedure Code(s):     --- Professional ---                        [phone], Colonoscopy, flexible; with removal of                         tumor(s), polyp(s), or other lesion(s) by snare                         technique Diagnosis Code(s):     --- Professional ---                        Z12.11, Encounter for screening for malignant neoplasm                         of colon                        K63.5, Polyp of colon CPT copyright 2019 American Medical Association. All rights reserved. The codes  documented in this report are preliminary and upon coder review may  be revised to meet current compliance requirements. Lucilla Lame MD, MD 02/18/2019 1:25:44 PM This report has been signed electronically. Number of Addenda: 0 Note Initiated On: 02/18/2019 1:01 PM Scope Withdrawal Time: 0 hours 9 minutes 36 seconds  Total Procedure Duration: 0 hours 12 minutes 58 seconds  Estimated Blood Loss:  Estimated blood loss: none.      [facility]

## 2019-02-19 LAB — SURGICAL PATHOLOGY

## 2019-02-20 NOTE — Anesthesia Postprocedure Evaluation (Signed)
Anesthesia Post Note  Patient: Alicia Rowe  Procedure(s) Performed: COLONOSCOPY WITH PROPOFOL (N/A )  Patient location during evaluation: Endoscopy Anesthesia Type: General Level of consciousness: awake and alert Pain management: pain level controlled Vital Signs Assessment: post-procedure vital signs reviewed and stable Respiratory status: spontaneous breathing, nonlabored ventilation and respiratory function stable Cardiovascular status: blood pressure returned to baseline and stable Postop Assessment: no apparent nausea or vomiting Anesthetic complications: no     Last Vitals:  Vitals:   02/18/19 1349 02/18/19 1359  BP: 124/90 126/84  Pulse: 89 88  Resp: 19 18  Temp:    SpO2: 93% 93%    Last Pain:  Vitals:   02/19/19 0744  TempSrc:   PainSc: 0-No pain                 Alphonsus Sias

## 2019-02-23 ENCOUNTER — Encounter: Payer: Self-pay | Admitting: Gastroenterology

## 2019-10-05 ENCOUNTER — Encounter: Payer: Self-pay | Admitting: Ophthalmology

## 2019-10-11 ENCOUNTER — Other Ambulatory Visit
Admission: RE | Admit: 2019-10-11 | Discharge: 2019-10-11 | Disposition: A | Discharge status: Home / Self Care | Payer: Medicare HMO | Source: Ambulatory Visit | Attending: Ophthalmology | Admitting: Ophthalmology

## 2019-10-11 ENCOUNTER — Other Ambulatory Visit: Payer: Self-pay

## 2019-10-11 DIAGNOSIS — Z01812 Encounter for preprocedural laboratory examination: Secondary | ICD-10-CM | POA: Insufficient documentation

## 2019-10-11 DIAGNOSIS — Z20822 Contact with and (suspected) exposure to covid-19: Secondary | ICD-10-CM | POA: Diagnosis not present

## 2019-10-11 LAB — SARS CORONAVIRUS 2 (TAT 6-24 HRS): SARS Coronavirus 2: NEGATIVE

## 2019-10-12 NOTE — Discharge Instructions (Signed)

## 2019-10-13 ENCOUNTER — Ambulatory Visit: Payer: Medicare HMO | Admitting: Anesthesiology

## 2019-10-13 ENCOUNTER — Ambulatory Visit
Admission: RE | Admit: 2019-10-13 | Discharge: 2019-10-13 | Disposition: A | Discharge status: Home / Self Care | Payer: Medicare HMO | Source: Ambulatory Visit | Attending: Ophthalmology | Admitting: Ophthalmology

## 2019-10-13 ENCOUNTER — Encounter
Admission: RE | Disposition: A | Discharge status: Home / Self Care | Payer: Self-pay | Source: Ambulatory Visit | Attending: Ophthalmology

## 2019-10-13 ENCOUNTER — Other Ambulatory Visit: Payer: Self-pay

## 2019-10-13 ENCOUNTER — Encounter: Payer: Self-pay | Admitting: Ophthalmology

## 2019-10-13 DIAGNOSIS — I10 Essential (primary) hypertension: Secondary | ICD-10-CM | POA: Diagnosis not present

## 2019-10-13 DIAGNOSIS — H2512 Age-related nuclear cataract, left eye: Secondary | ICD-10-CM | POA: Diagnosis present

## 2019-10-13 DIAGNOSIS — E7800 Pure hypercholesterolemia, unspecified: Secondary | ICD-10-CM | POA: Insufficient documentation

## 2019-10-13 DIAGNOSIS — G4730 Sleep apnea, unspecified: Secondary | ICD-10-CM | POA: Diagnosis not present

## 2019-10-13 DIAGNOSIS — F17200 Nicotine dependence, unspecified, uncomplicated: Secondary | ICD-10-CM | POA: Insufficient documentation

## 2019-10-13 DIAGNOSIS — F329 Major depressive disorder, single episode, unspecified: Secondary | ICD-10-CM | POA: Diagnosis not present

## 2019-10-13 DIAGNOSIS — R002 Palpitations: Secondary | ICD-10-CM | POA: Diagnosis not present

## 2019-10-13 DIAGNOSIS — R2243 Localized swelling, mass and lump, lower limb, bilateral: Secondary | ICD-10-CM | POA: Insufficient documentation

## 2019-10-13 DIAGNOSIS — Z885 Allergy status to narcotic agent status: Secondary | ICD-10-CM | POA: Diagnosis not present

## 2019-10-13 HISTORY — DX: Essential (primary) hypertension: I10

## 2019-10-13 HISTORY — DX: Hyperlipidemia, unspecified: E78.5

## 2019-10-13 HISTORY — PX: CATARACT EXTRACTION W/PHACO: SHX586

## 2019-10-13 SURGERY — PHACOEMULSIFICATION, CATARACT, WITH IOL INSERTION
Anesthesia: Monitor Anesthesia Care | Site: Eye | Laterality: Left

## 2019-10-13 MED ORDER — FENTANYL CITRATE (PF) 100 MCG/2ML IJ SOLN
INTRAMUSCULAR | Status: DC | PRN
Start: 2019-10-13 — End: 2019-10-13
  Administered 2019-10-13 (×2): 50 ug via INTRAVENOUS

## 2019-10-13 MED ORDER — ACETAMINOPHEN 160 MG/5ML PO SOLN: 325.0000 mg | Freq: Once | ORAL | Status: DC

## 2019-10-13 MED ORDER — TETRACAINE HCL 0.5 % OP SOLN
1.0000 [drp] | OPHTHALMIC | Status: DC | PRN
  Administered 2019-10-13 (×3): 1 [drp] via OPHTHALMIC

## 2019-10-13 MED ORDER — LIDOCAINE HCL (PF) 2 % IJ SOLN
INTRAOCULAR | Status: DC | PRN
  Administered 2019-10-13: 2 mL

## 2019-10-13 MED ORDER — ACETAMINOPHEN 325 MG PO TABS: 325.0000 mg | ORAL_TABLET | Freq: Once | ORAL | Status: DC

## 2019-10-13 MED ORDER — MOXIFLOXACIN HCL 0.5 % OP SOLN
1.0000 [drp] | OPHTHALMIC | Status: DC | PRN
  Administered 2019-10-13 (×3): 1 [drp] via OPHTHALMIC

## 2019-10-13 MED ORDER — ARMC OPHTHALMIC DILATING DROPS
1.0000 "application " | OPHTHALMIC | Status: DC | PRN
  Administered 2019-10-13 (×3): 1 via OPHTHALMIC

## 2019-10-13 MED ORDER — BRIMONIDINE TARTRATE-TIMOLOL 0.2-0.5 % OP SOLN
OPHTHALMIC | Status: DC | PRN
  Administered 2019-10-13: 1 [drp] via OPHTHALMIC

## 2019-10-13 MED ORDER — LACTATED RINGERS IV SOLN: INTRAVENOUS | Status: DC

## 2019-10-13 MED ORDER — EPINEPHRINE PF 1 MG/ML IJ SOLN
INTRAOCULAR | Status: DC | PRN
  Administered 2019-10-13: 71 mL via OPHTHALMIC

## 2019-10-13 MED ORDER — NA HYALUR & NA CHOND-NA HYALUR 0.4-0.35 ML IO KIT
PACK | INTRAOCULAR | Status: DC | PRN
  Administered 2019-10-13: 1 mL via INTRAOCULAR

## 2019-10-13 MED ORDER — CEFUROXIME OPHTHALMIC INJECTION 1 MG/0.1 ML
INJECTION | OPHTHALMIC | Status: DC | PRN
  Administered 2019-10-13: 0.1 mL via INTRACAMERAL

## 2019-10-13 SURGICAL SUPPLY — 23 items
CANNULA ANT/CHMB 27G (MISCELLANEOUS) ×1 IMPLANT
CANNULA ANT/CHMB 27GA (MISCELLANEOUS) ×3 IMPLANT
GLOVE SURG LX 7.5 STRW (GLOVE) ×2
GLOVE SURG LX STRL 7.5 STRW (GLOVE) ×1 IMPLANT
GLOVE SURG TRIUMPH 8.0 PF LTX (GLOVE) ×3 IMPLANT
GOWN STRL REUS W/ TWL LRG LVL3 (GOWN DISPOSABLE) ×2 IMPLANT
GOWN STRL REUS W/TWL LRG LVL3 (GOWN DISPOSABLE) ×6
LENS IOL DIOP 19.0 (Intraocular Lens) ×3 IMPLANT
LENS IOL TECNIS MONO 19.0 (Intraocular Lens) IMPLANT
MARKER SKIN DUAL TIP RULER LAB (MISCELLANEOUS) ×3 IMPLANT
NDL CAPSULORHEX 25GA (NEEDLE) ×1 IMPLANT
NDL FILTER BLUNT 18X1 1/2 (NEEDLE) ×2 IMPLANT
NEEDLE CAPSULORHEX 25GA (NEEDLE) ×3 IMPLANT
NEEDLE FILTER BLUNT 18X 1/2SAF (NEEDLE) ×4
NEEDLE FILTER BLUNT 18X1 1/2 (NEEDLE) ×2 IMPLANT
PACK CATARACT BRASINGTON (MISCELLANEOUS) ×3 IMPLANT
PACK EYE AFTER SURG (MISCELLANEOUS) ×3 IMPLANT
PACK OPTHALMIC (MISCELLANEOUS) ×3 IMPLANT
SOLUTION OPHTHALMIC SALT (MISCELLANEOUS) ×3 IMPLANT
SYR 3ML LL SCALE MARK (SYRINGE) ×6 IMPLANT
SYR TB 1ML LUER SLIP (SYRINGE) ×3 IMPLANT
WATER STERILE IRR 250ML POUR (IV SOLUTION) ×3 IMPLANT
WIPE NON LINTING 3.25X3.25 (MISCELLANEOUS) ×3 IMPLANT

## 2019-10-13 NOTE — Anesthesia Preprocedure Evaluation (Signed)
Anesthesia Evaluation  Patient identified by MRN, date of birth, ID band Patient awake    Reviewed: Allergy & Precautions, H&P , NPO status , Patient's Chart, lab work & pertinent test results  Airway Mallampati: II  TM Distance: >3 FB Neck ROM: full    Dental no notable dental hx. (+) Missing   Pulmonary sleep apnea , Current Smoker and Patient abstained from smoking.,    Pulmonary exam normal breath sounds clear to auscultation       Cardiovascular hypertension, Normal cardiovascular exam Rhythm:regular Rate:Normal     Neuro/Psych PSYCHIATRIC DISORDERS    GI/Hepatic   Endo/Other    Renal/GU      Musculoskeletal   Abdominal   Peds  Hematology   Anesthesia Other Findings   Reproductive/Obstetrics                             Anesthesia Physical Anesthesia Plan  ASA: II  Anesthesia Plan: MAC   Post-op Pain Management:    Induction:   PONV Risk Score and Plan: 2 and Treatment may vary due to age or medical condition, TIVA and Midazolam  Airway Management Planned:   Additional Equipment:   Intra-op Plan:   Post-operative Plan:   Informed Consent: I have reviewed the patients History and Physical, chart, labs and discussed the procedure including the risks, benefits and alternatives for the proposed anesthesia with the patient or authorized representative who has indicated his/her understanding and acceptance.     Dental Advisory Given  Plan Discussed with: CRNA  Anesthesia Plan Comments:         Anesthesia Quick Evaluation

## 2019-10-13 NOTE — Op Note (Signed)
OPERATIVE NOTE  Kamil Hanigan [id number] 10/13/2019   PREOPERATIVE DIAGNOSIS:  Nuclear sclerotic cataract left eye. H25.12   POSTOPERATIVE DIAGNOSIS:    Nuclear sclerotic cataract left eye.     PROCEDURE:  Phacoemusification with posterior chamber intraocular lens placement of the left eye  Ultrasound time: Procedure(s): CATARACT EXTRACTION PHACO AND INTRAOCULAR LENS PLACEMENT (IOC) LEFT 9.78 01:13.7 13.3% (Left)  LENS:   Implant Name Type Inv. Item Serial No. Manufacturer Lot No. LRB No. Used Action  LENS IOL DIOP 19.0 - [id number] Intraocular Lens LENS IOL DIOP 19.0 [id number] [employer]  Left 1 Implanted      SURGEON:  Wyonia Hough, MD   ANESTHESIA:  Topical with tetracaine drops and 2% Xylocaine jelly, augmented with 1% preservative-free intracameral lidocaine.    COMPLICATIONS:  None.   DESCRIPTION OF PROCEDURE:  The patient was identified in the holding room and transported to the operating room and placed in the supine position under the operating microscope.  The left eye was identified as the operative eye and it was prepped and draped in the usual sterile ophthalmic fashion.   A 1 millimeter clear-corneal paracentesis was made at the 1:30 position.  0.5 ml of preservative-free 1% lidocaine was injected into the anterior chamber.  The anterior chamber was filled with Viscoat viscoelastic.  A 2.4 millimeter keratome was used to make a near-clear corneal incision at the 10:30 position.  .  A curvilinear capsulorrhexis was made with a cystotome and capsulorrhexis forceps.  Balanced salt solution was used to hydrodissect and hydrodelineate the nucleus.   Phacoemulsification was then used in stop and chop fashion to remove the lens nucleus and epinucleus.  The remaining cortex was then removed using the irrigation and aspiration handpiece. Provisc was then placed into the capsular bag to distend it for lens placement.  A lens was then injected into the  capsular bag.  The remaining viscoelastic was aspirated.   Wounds were hydrated with balanced salt solution.  The anterior chamber was inflated to a physiologic pressure with balanced salt solution.  No wound leaks were noted. Cefuroxime 0.1 ml of a 10mg /ml solution was injected into the anterior chamber for a dose of 1 mg of intracameral antibiotic at the completion of the case.   Timolol and Brimonidine drops were applied to the eye.  The patient was taken to the recovery room in stable condition without complications of anesthesia or surgery.  Emrys Mckamie 10/13/2019, 9:08 AM

## 2019-10-13 NOTE — Anesthesia Procedure Notes (Signed)
Procedure Name: MAC Date/Time: 10/13/2019 8:48 AM Performed by: Jeannene Patella, CRNA Pre-anesthesia Checklist: Patient identified, Emergency Drugs available, Suction available, Timeout performed and Patient being monitored Patient Re-evaluated:Patient Re-evaluated prior to induction Oxygen Delivery Method: Nasal cannula Placement Confirmation: positive ETCO2

## 2019-10-13 NOTE — H&P (Signed)

## 2019-10-13 NOTE — Transfer of Care (Signed)
Immediate Anesthesia Transfer of Care Note  Patient: Alicia Rowe  Procedure(s) Performed: CATARACT EXTRACTION PHACO AND INTRAOCULAR LENS PLACEMENT (IOC) LEFT 9.78 01:13.7 13.3% (Left Eye)  Patient Location: PACU  Anesthesia Type: MAC  Level of Consciousness: awake, alert  and patient cooperative  Airway and Oxygen Therapy: Patient Spontanous Breathing and Patient connected to supplemental oxygen  Post-op Assessment: Post-op Vital signs reviewed, Patient's Cardiovascular Status Stable, Respiratory Function Stable, Patent Airway and No signs of Nausea or vomiting  Post-op Vital Signs: Reviewed and stable  Complications: No complications documented.

## 2019-10-13 NOTE — Anesthesia Postprocedure Evaluation (Signed)
Anesthesia Post Note  Patient: Alicia Rowe  Procedure(s) Performed: CATARACT EXTRACTION PHACO AND INTRAOCULAR LENS PLACEMENT (IOC) LEFT 9.78 01:13.7 13.3% (Left Eye)     Patient location during evaluation: PACU Anesthesia Type: MAC Level of consciousness: awake and alert and oriented Pain management: satisfactory to patient Vital Signs Assessment: post-procedure vital signs reviewed and stable Respiratory status: spontaneous breathing, nonlabored ventilation and respiratory function stable Cardiovascular status: blood pressure returned to baseline and stable Postop Assessment: Adequate PO intake and No signs of nausea or vomiting Anesthetic complications: no   No complications documented.  Raliegh Ip

## 2019-10-14 ENCOUNTER — Encounter: Payer: Self-pay | Admitting: Ophthalmology

## 2019-10-27 ENCOUNTER — Encounter: Payer: Self-pay | Admitting: Ophthalmology

## 2019-10-27 ENCOUNTER — Other Ambulatory Visit: Payer: Self-pay

## 2019-11-01 ENCOUNTER — Other Ambulatory Visit: Payer: Self-pay

## 2019-11-01 ENCOUNTER — Other Ambulatory Visit
Admission: RE | Admit: 2019-11-01 | Discharge: 2019-11-01 | Disposition: A | Discharge status: Home / Self Care | Payer: Medicare HMO | Source: Ambulatory Visit | Attending: Ophthalmology | Admitting: Ophthalmology

## 2019-11-01 DIAGNOSIS — Z20822 Contact with and (suspected) exposure to covid-19: Secondary | ICD-10-CM | POA: Diagnosis not present

## 2019-11-01 DIAGNOSIS — Z01812 Encounter for preprocedural laboratory examination: Secondary | ICD-10-CM | POA: Insufficient documentation

## 2019-11-01 NOTE — Discharge Instructions (Signed)

## 2019-11-02 LAB — SARS CORONAVIRUS 2 (TAT 6-24 HRS): SARS Coronavirus 2: NEGATIVE

## 2019-11-03 ENCOUNTER — Other Ambulatory Visit: Payer: Self-pay

## 2019-11-03 ENCOUNTER — Ambulatory Visit: Payer: Medicare HMO | Admitting: Anesthesiology

## 2019-11-03 ENCOUNTER — Encounter: Payer: Self-pay | Admitting: Ophthalmology

## 2019-11-03 ENCOUNTER — Encounter
Admission: RE | Disposition: A | Discharge status: Home / Self Care | Payer: Self-pay | Source: Home / Self Care | Attending: Ophthalmology

## 2019-11-03 ENCOUNTER — Ambulatory Visit
Admission: RE | Admit: 2019-11-03 | Discharge: 2019-11-03 | Disposition: A | Discharge status: Home / Self Care | Payer: Medicare HMO | Attending: Ophthalmology | Admitting: Ophthalmology

## 2019-11-03 DIAGNOSIS — E7800 Pure hypercholesterolemia, unspecified: Secondary | ICD-10-CM | POA: Diagnosis not present

## 2019-11-03 DIAGNOSIS — I10 Essential (primary) hypertension: Secondary | ICD-10-CM | POA: Diagnosis not present

## 2019-11-03 DIAGNOSIS — Z885 Allergy status to narcotic agent status: Secondary | ICD-10-CM | POA: Diagnosis not present

## 2019-11-03 DIAGNOSIS — Z7984 Long term (current) use of oral hypoglycemic drugs: Secondary | ICD-10-CM | POA: Insufficient documentation

## 2019-11-03 DIAGNOSIS — G4730 Sleep apnea, unspecified: Secondary | ICD-10-CM | POA: Insufficient documentation

## 2019-11-03 DIAGNOSIS — F329 Major depressive disorder, single episode, unspecified: Secondary | ICD-10-CM | POA: Diagnosis not present

## 2019-11-03 DIAGNOSIS — F17200 Nicotine dependence, unspecified, uncomplicated: Secondary | ICD-10-CM | POA: Diagnosis not present

## 2019-11-03 DIAGNOSIS — F419 Anxiety disorder, unspecified: Secondary | ICD-10-CM | POA: Diagnosis not present

## 2019-11-03 DIAGNOSIS — Z9842 Cataract extraction status, left eye: Secondary | ICD-10-CM | POA: Insufficient documentation

## 2019-11-03 DIAGNOSIS — Z79899 Other long term (current) drug therapy: Secondary | ICD-10-CM | POA: Insufficient documentation

## 2019-11-03 DIAGNOSIS — H2511 Age-related nuclear cataract, right eye: Secondary | ICD-10-CM | POA: Diagnosis present

## 2019-11-03 HISTORY — PX: CATARACT EXTRACTION W/PHACO: SHX586

## 2019-11-03 HISTORY — DX: Other complications of anesthesia, initial encounter: T88.59XA

## 2019-11-03 SURGERY — PHACOEMULSIFICATION, CATARACT, WITH IOL INSERTION
Anesthesia: Monitor Anesthesia Care | Site: Eye | Laterality: Right

## 2019-11-03 MED ORDER — ACETAMINOPHEN 160 MG/5ML PO SOLN: 325.0000 mg | ORAL | Status: DC | PRN

## 2019-11-03 MED ORDER — ARMC OPHTHALMIC DILATING DROPS
1.0000 "application " | OPHTHALMIC | Status: DC | PRN
  Administered 2019-11-03 (×3): 1 via OPHTHALMIC

## 2019-11-03 MED ORDER — ACETAMINOPHEN 325 MG PO TABS: 325.0000 mg | ORAL_TABLET | ORAL | Status: DC | PRN

## 2019-11-03 MED ORDER — CEFUROXIME OPHTHALMIC INJECTION 1 MG/0.1 ML
INJECTION | OPHTHALMIC | Status: DC | PRN
  Administered 2019-11-03: 0.1 mL via INTRACAMERAL

## 2019-11-03 MED ORDER — TETRACAINE HCL 0.5 % OP SOLN
1.0000 [drp] | OPHTHALMIC | Status: DC | PRN
  Administered 2019-11-03 (×3): 1 [drp] via OPHTHALMIC

## 2019-11-03 MED ORDER — LIDOCAINE HCL (PF) 2 % IJ SOLN
INTRAOCULAR | Status: DC | PRN
  Administered 2019-11-03: 2 mL

## 2019-11-03 MED ORDER — LACTATED RINGERS IV SOLN: INTRAVENOUS | Status: DC

## 2019-11-03 MED ORDER — EPINEPHRINE PF 1 MG/ML IJ SOLN
INTRAOCULAR | Status: DC | PRN
  Administered 2019-11-03: 67 mL via OPHTHALMIC

## 2019-11-03 MED ORDER — BRIMONIDINE TARTRATE-TIMOLOL 0.2-0.5 % OP SOLN
OPHTHALMIC | Status: DC | PRN
  Administered 2019-11-03: 1 [drp] via OPHTHALMIC

## 2019-11-03 MED ORDER — MOXIFLOXACIN HCL 0.5 % OP SOLN
1.0000 [drp] | OPHTHALMIC | Status: DC | PRN
  Administered 2019-11-03 (×3): 1 [drp] via OPHTHALMIC

## 2019-11-03 MED ORDER — FENTANYL CITRATE (PF) 100 MCG/2ML IJ SOLN
INTRAMUSCULAR | Status: DC | PRN
  Administered 2019-11-03: 100 ug via INTRAVENOUS

## 2019-11-03 MED ORDER — NA HYALUR & NA CHOND-NA HYALUR 0.4-0.35 ML IO KIT
PACK | INTRAOCULAR | Status: DC | PRN
  Administered 2019-11-03: 1 mL via INTRAOCULAR

## 2019-11-03 SURGICAL SUPPLY — 23 items
CANNULA ANT/CHMB 27G (MISCELLANEOUS) ×1 IMPLANT
CANNULA ANT/CHMB 27GA (MISCELLANEOUS) ×2 IMPLANT
GLOVE SURG LX 7.5 STRW (GLOVE) ×1
GLOVE SURG LX STRL 7.5 STRW (GLOVE) ×1 IMPLANT
GLOVE SURG TRIUMPH 8.0 PF LTX (GLOVE) ×2 IMPLANT
GOWN STRL REUS W/ TWL LRG LVL3 (GOWN DISPOSABLE) ×2 IMPLANT
GOWN STRL REUS W/TWL LRG LVL3 (GOWN DISPOSABLE) ×4
LENS IOL DIOP 19.5 (Intraocular Lens) ×2 IMPLANT
LENS IOL TECNIS MONO 19.5 (Intraocular Lens) IMPLANT
MARKER SKIN DUAL TIP RULER LAB (MISCELLANEOUS) ×2 IMPLANT
NDL CAPSULORHEX 25GA (NEEDLE) ×1 IMPLANT
NDL FILTER BLUNT 18X1 1/2 (NEEDLE) ×2 IMPLANT
NEEDLE CAPSULORHEX 25GA (NEEDLE) ×2 IMPLANT
NEEDLE FILTER BLUNT 18X 1/2SAF (NEEDLE) ×2
NEEDLE FILTER BLUNT 18X1 1/2 (NEEDLE) ×2 IMPLANT
PACK CATARACT BRASINGTON (MISCELLANEOUS) ×2 IMPLANT
PACK EYE AFTER SURG (MISCELLANEOUS) ×2 IMPLANT
PACK OPTHALMIC (MISCELLANEOUS) ×2 IMPLANT
SOLUTION OPHTHALMIC SALT (MISCELLANEOUS) ×2 IMPLANT
SYR 3ML LL SCALE MARK (SYRINGE) ×4 IMPLANT
SYR TB 1ML LUER SLIP (SYRINGE) ×2 IMPLANT
WATER STERILE IRR 250ML POUR (IV SOLUTION) ×2 IMPLANT
WIPE NON LINTING 3.25X3.25 (MISCELLANEOUS) ×2 IMPLANT

## 2019-11-03 NOTE — Anesthesia Postprocedure Evaluation (Signed)
Anesthesia Post Note  Patient: Alicia Rowe  Procedure(s) Performed: CATARACT EXTRACTION PHACO AND INTRAOCULAR LENS PLACEMENT (IOC) RIGHT 4.60 00:46.2 9.9% (Right Eye)     Patient location during evaluation: PACU Anesthesia Type: MAC Level of consciousness: awake and alert Pain management: pain level controlled Vital Signs Assessment: post-procedure vital signs reviewed and stable Respiratory status: spontaneous breathing, nonlabored ventilation, respiratory function stable and patient connected to nasal cannula oxygen Cardiovascular status: stable and blood pressure returned to baseline Postop Assessment: no apparent nausea or vomiting Anesthetic complications: no   No complications documented.  Trecia Rogers

## 2019-11-03 NOTE — Anesthesia Procedure Notes (Signed)
Procedure Name: MAC Date/Time: 11/03/2019 11:27 AM Performed by: Silvana Newness, CRNA Pre-anesthesia Checklist: Patient identified, Emergency Drugs available, Suction available, Patient being monitored and Timeout performed Patient Re-evaluated:Patient Re-evaluated prior to induction Oxygen Delivery Method: Nasal cannula Placement Confirmation: positive ETCO2

## 2019-11-03 NOTE — Op Note (Signed)
LOCATION:  [place]   PREOPERATIVE DIAGNOSIS:    Nuclear sclerotic cataract right eye. H25.11   POSTOPERATIVE DIAGNOSIS:  Nuclear sclerotic cataract right eye.     PROCEDURE:  Phacoemusification with posterior chamber intraocular lens placement of the right eye   ULTRASOUND TIME: Procedure(s): CATARACT EXTRACTION PHACO AND INTRAOCULAR LENS PLACEMENT (IOC) RIGHT 4.60 00:46.2 9.9% (Right)  LENS:   Implant Name Type Inv. Item Serial No. Manufacturer Lot No. LRB No. Used Action  LENS IOL DIOP 19.5 - [id number] Intraocular Lens LENS IOL DIOP 19.5 [id number] [id number]   Right 1 Implanted         SURGEON:  Wyonia Hough, MD   ANESTHESIA:  Topical with tetracaine drops and 2% Xylocaine jelly, augmented with 1% preservative-free intracameral lidocaine.    COMPLICATIONS:  None.   DESCRIPTION OF PROCEDURE:  The patient was identified in the holding room and transported to the operating room and placed in the supine position under the operating microscope.  The right eye was identified as the operative eye and it was prepped and draped in the usual sterile ophthalmic fashion.   A 1 millimeter clear-corneal paracentesis was made at the 12:00 position.  0.5 ml of preservative-free 1% lidocaine was injected into the anterior chamber. The anterior chamber was filled with Viscoat viscoelastic.  A 2.4 millimeter keratome was used to make a near-clear corneal incision at the 9:00 position.  A curvilinear capsulorrhexis was made with a cystotome and capsulorrhexis forceps.  Balanced salt solution was used to hydrodissect and hydrodelineate the nucleus.   Phacoemulsification was then used in stop and chop fashion to remove the lens nucleus and epinucleus.  The remaining cortex was then removed using the irrigation and aspiration handpiece. Provisc was then placed into the capsular bag to distend it for lens placement.  A lens was then injected into the capsular bag.  The remaining  viscoelastic was aspirated.   Wounds were hydrated with balanced salt solution.  The anterior chamber was inflated to a physiologic pressure with balanced salt solution.  No wound leaks were noted. Cefuroxime 0.1 ml of a 10mg /ml solution was injected into the anterior chamber for a dose of 1 mg of intracameral antibiotic at the completion of the case.   Timolol and Brimonidine drops were applied to the eye.  The patient was taken to the recovery room in stable condition without complications of anesthesia or surgery.   Jhalil Silvera 11/03/2019, 11:46 AM

## 2019-11-03 NOTE — H&P (Signed)

## 2019-11-03 NOTE — Transfer of Care (Signed)
Immediate Anesthesia Transfer of Care Note  Patient: Alicia Rowe  Procedure(s) Performed: CATARACT EXTRACTION PHACO AND INTRAOCULAR LENS PLACEMENT (IOC) RIGHT 4.60 00:46.2 9.9% (Right Eye)  Patient Location: PACU  Anesthesia Type: MAC  Level of Consciousness: awake, alert  and patient cooperative  Airway and Oxygen Therapy: Patient Spontanous Breathing and Patient connected to supplemental oxygen  Post-op Assessment: Post-op Vital signs reviewed, Patient's Cardiovascular Status Stable, Respiratory Function Stable, Patent Airway and No signs of Nausea or vomiting  Post-op Vital Signs: Reviewed and stable  Complications: No complications documented.

## 2019-11-03 NOTE — Anesthesia Preprocedure Evaluation (Signed)
Anesthesia Evaluation  Patient identified by MRN, date of birth, ID band Patient awake    Reviewed: Allergy & Precautions, H&P , NPO status , Patient's Chart, lab work & pertinent test results, reviewed documented beta blocker date and time   Airway Mallampati: II  TM Distance: >3 FB Neck ROM: full    Dental no notable dental hx.    Pulmonary Current Smoker and Patient abstained from smoking.,    Pulmonary exam normal breath sounds clear to auscultation       Cardiovascular Exercise Tolerance: Good hypertension, Normal cardiovascular exam Rhythm:regular Rate:Normal     Neuro/Psych Anxiety negative neurological ROS     GI/Hepatic negative GI ROS, Neg liver ROS,   Endo/Other  negative endocrine ROS  Renal/GU negative Renal ROS  negative genitourinary   Musculoskeletal   Abdominal   Peds  Hematology negative hematology ROS (+)   Anesthesia Other Findings   Reproductive/Obstetrics negative OB ROS                             Anesthesia Physical Anesthesia Plan  ASA: II  Anesthesia Plan: MAC   Post-op Pain Management:    Induction:   PONV Risk Score and Plan:   Airway Management Planned:   Additional Equipment:   Intra-op Plan:   Post-operative Plan:   Informed Consent: I have reviewed the patients History and Physical, chart, labs and discussed the procedure including the risks, benefits and alternatives for the proposed anesthesia with the patient or authorized representative who has indicated his/her understanding and acceptance.     Dental Advisory Given  Plan Discussed with: CRNA and Anesthesiologist  Anesthesia Plan Comments:         Anesthesia Quick Evaluation

## 2019-12-27 ENCOUNTER — Other Ambulatory Visit: Payer: Self-pay

## 2019-12-27 ENCOUNTER — Ambulatory Visit
Admission: RE | Admit: 2019-12-27 | Discharge: 2019-12-27 | Disposition: A | Discharge status: Home / Self Care | Payer: Medicare HMO | Source: Ambulatory Visit | Attending: Internal Medicine | Admitting: Internal Medicine

## 2019-12-27 DIAGNOSIS — Z1231 Encounter for screening mammogram for malignant neoplasm of breast: Secondary | ICD-10-CM | POA: Insufficient documentation

## 2020-12-13 ENCOUNTER — Other Ambulatory Visit: Payer: Self-pay | Admitting: Internal Medicine

## 2020-12-13 DIAGNOSIS — Z1231 Encounter for screening mammogram for malignant neoplasm of breast: Secondary | ICD-10-CM

## 2021-09-19 ENCOUNTER — Other Ambulatory Visit: Payer: Self-pay | Admitting: Internal Medicine

## 2021-09-19 DIAGNOSIS — Z1231 Encounter for screening mammogram for malignant neoplasm of breast: Secondary | ICD-10-CM

## 2021-10-17 ENCOUNTER — Ambulatory Visit
Admission: RE | Admit: 2021-10-17 | Discharge: 2021-10-17 | Disposition: A | Discharge status: Home / Self Care | Payer: Medicare HMO | Source: Ambulatory Visit | Attending: Internal Medicine | Admitting: Internal Medicine

## 2021-10-17 DIAGNOSIS — Z1231 Encounter for screening mammogram for malignant neoplasm of breast: Secondary | ICD-10-CM | POA: Diagnosis not present

## 2021-10-18 IMAGING — MG DIGITAL SCREENING BILAT W/ TOMO W/ CAD
8 series · 8 of 24 positions shown · non-contrast
Comparison: Previous exam(s).

CLINICAL DATA: Screening.

EXAM:
DIGITAL SCREENING BILATERAL MAMMOGRAM WITH TOMO AND CAD

[L CC synth-2D]
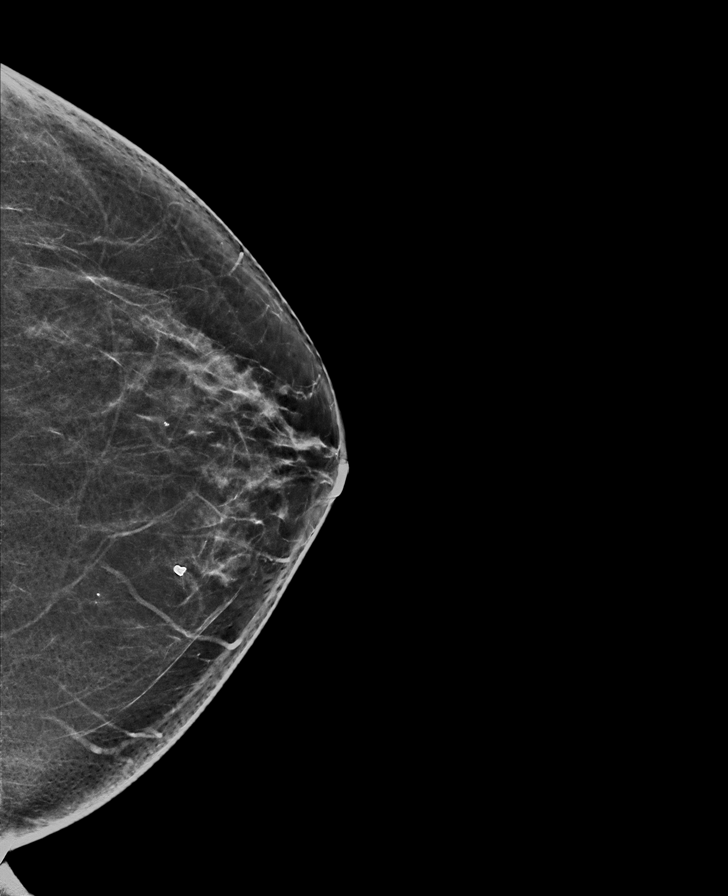

[L MLO synth-2D]
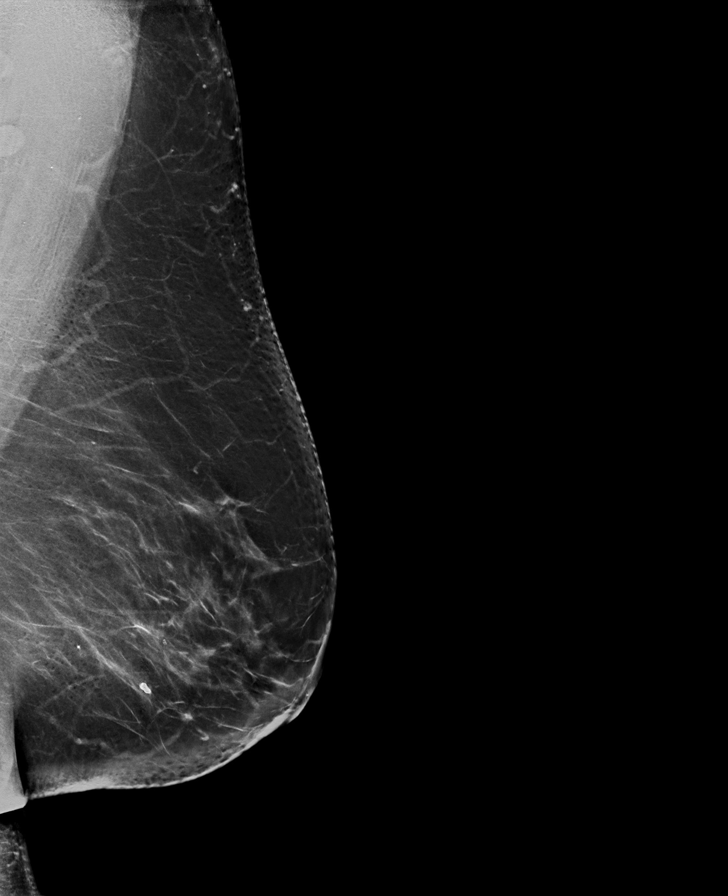

[R CC synth-2D]
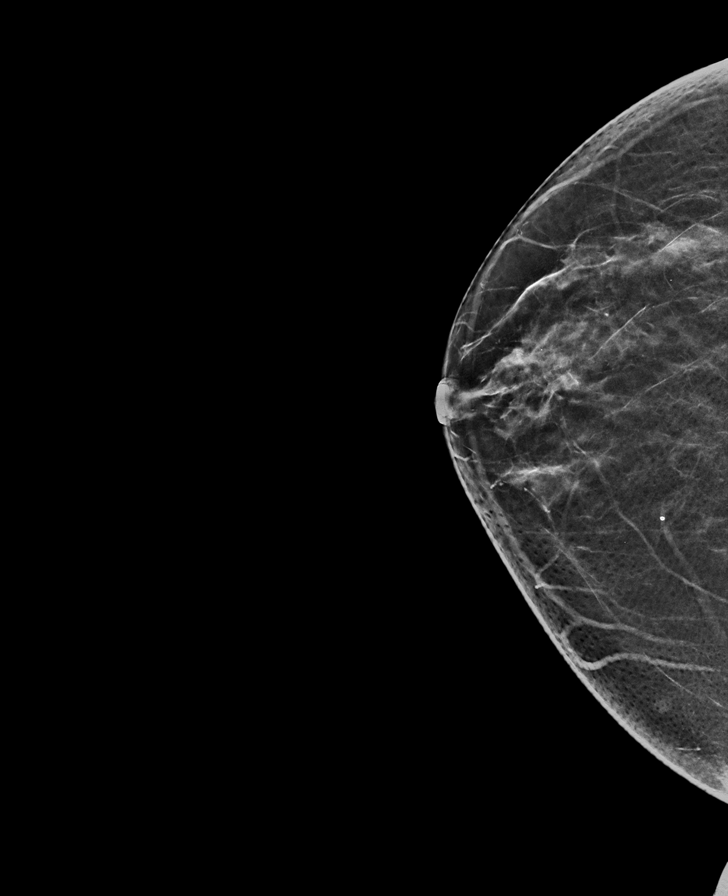

[R MLO synth-2D]
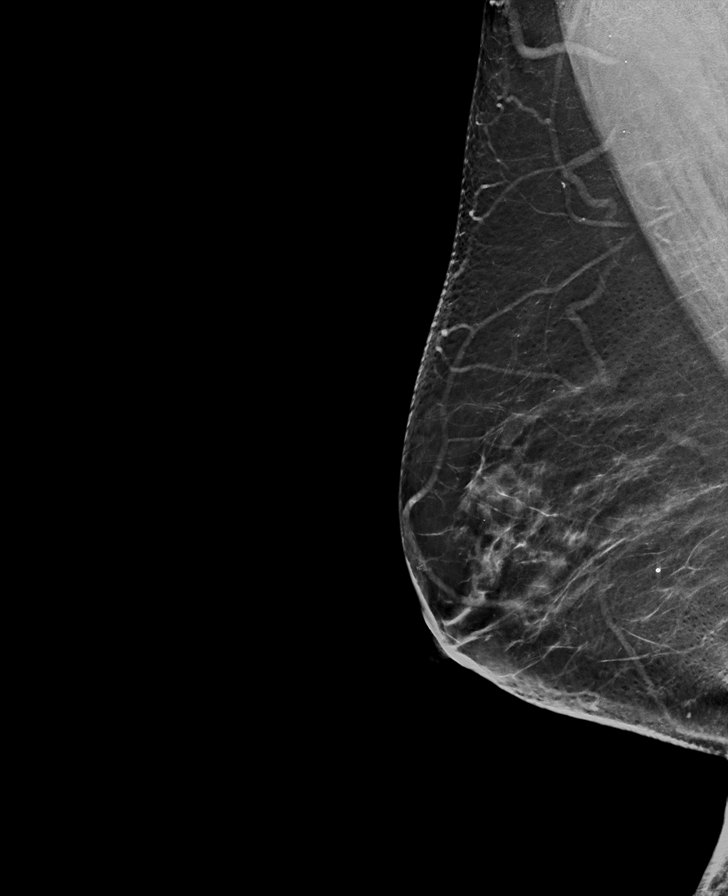

[R CC tomo · tomo slice 32/63.0]
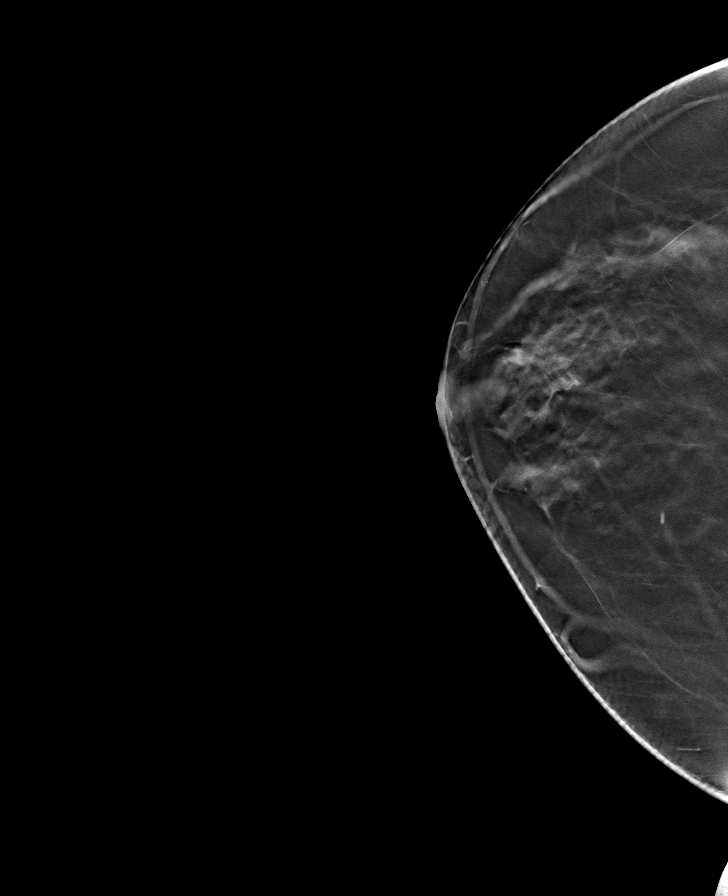

[L MLO tomo · tomo slice 47/94.0]
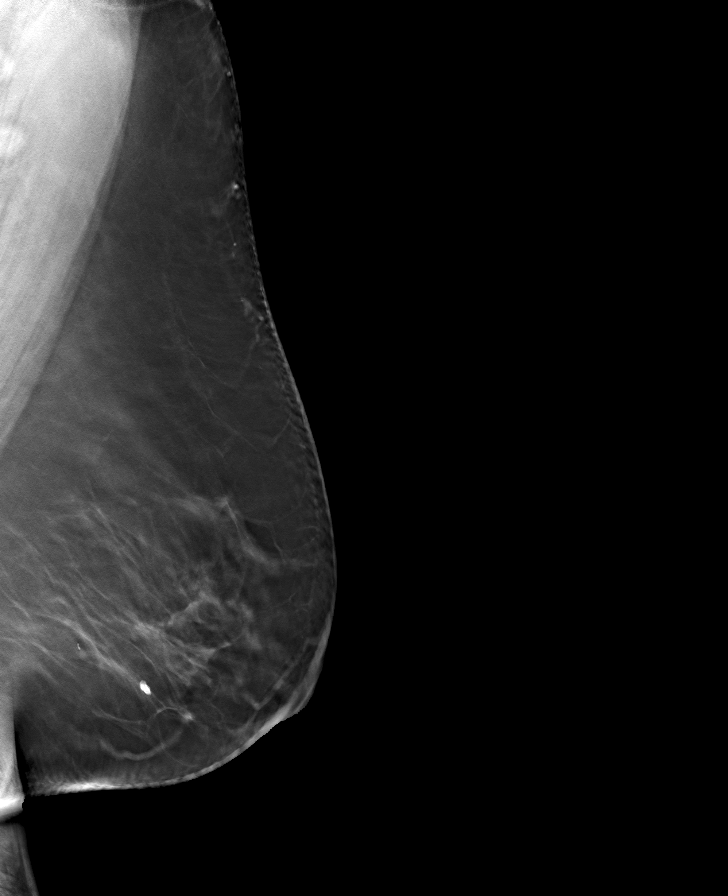

[L CC tomo · tomo slice 35/70.0]
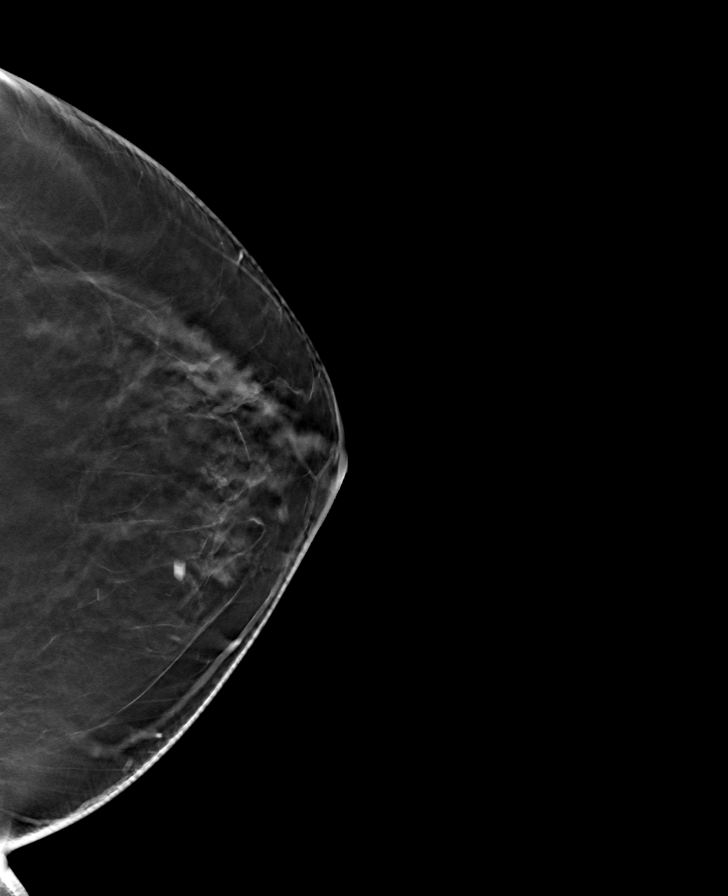

[R MLO tomo · tomo slice 40/79.0]
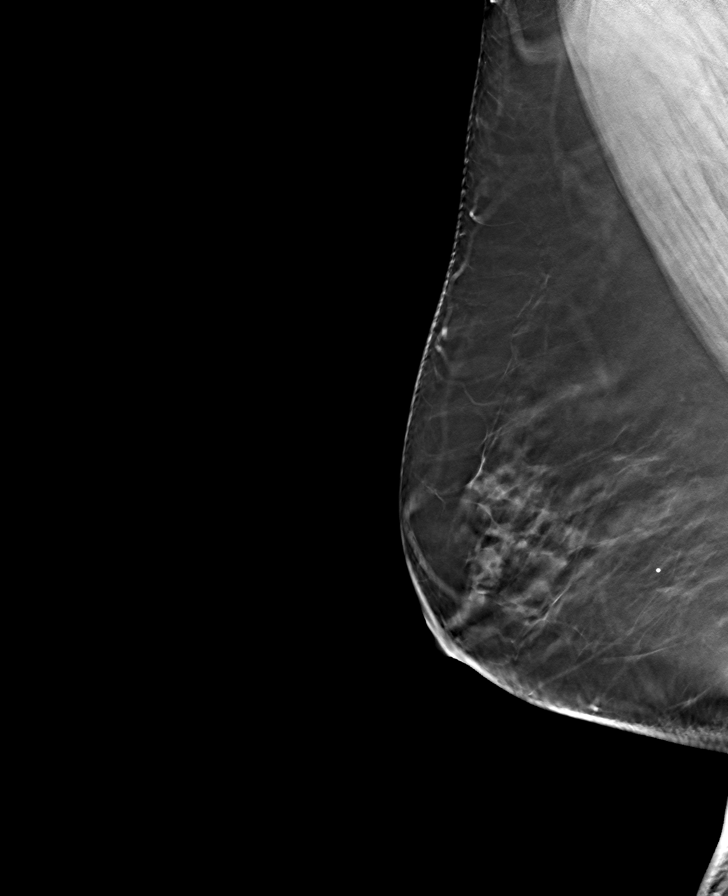

[8 of 24 positions shown; findings below may reference images not displayed]

ACR Breast Density Category b: There are scattered areas of
fibroglandular density.
FINDINGS: There are no findings suspicious for malignancy. Images were
processed with CAD.
IMPRESSION: No mammographic evidence of malignancy. A result letter of this
screening mammogram will be mailed directly to the patient.

RECOMMENDATION:
Screening mammogram in one year. (Code:[id number])

BI-RADS CATEGORY  1: Negative.

## 2022-07-23 ENCOUNTER — Other Ambulatory Visit: Payer: Self-pay

## 2022-07-23 DIAGNOSIS — Z1231 Encounter for screening mammogram for malignant neoplasm of breast: Secondary | ICD-10-CM

## 2022-10-21 ENCOUNTER — Ambulatory Visit
Admission: RE | Admit: 2022-10-21 | Discharge: 2022-10-21 | Disposition: A | Discharge status: Home / Self Care | Payer: Medicare HMO | Source: Ambulatory Visit | Attending: Internal Medicine | Admitting: Internal Medicine

## 2022-10-21 DIAGNOSIS — Z1231 Encounter for screening mammogram for malignant neoplasm of breast: Secondary | ICD-10-CM | POA: Insufficient documentation

## 2022-10-24 ENCOUNTER — Other Ambulatory Visit: Payer: Self-pay | Admitting: Internal Medicine

## 2022-10-24 DIAGNOSIS — Z1231 Encounter for screening mammogram for malignant neoplasm of breast: Secondary | ICD-10-CM

## 2022-10-31 ENCOUNTER — Ambulatory Visit
Admission: RE | Admit: 2022-10-31 | Discharge: 2022-10-31 | Disposition: A | Discharge status: Home / Self Care | Payer: Medicare HMO | Source: Ambulatory Visit | Attending: Internal Medicine | Admitting: Internal Medicine

## 2022-10-31 DIAGNOSIS — Z1231 Encounter for screening mammogram for malignant neoplasm of breast: Secondary | ICD-10-CM | POA: Insufficient documentation

## 2022-10-31 DIAGNOSIS — Z1239 Encounter for other screening for malignant neoplasm of breast: Secondary | ICD-10-CM | POA: Diagnosis present

## 2022-10-31 DIAGNOSIS — R921 Mammographic calcification found on diagnostic imaging of breast: Secondary | ICD-10-CM | POA: Diagnosis not present

## 2022-11-04 ENCOUNTER — Other Ambulatory Visit: Payer: Self-pay | Admitting: Internal Medicine

## 2022-11-04 DIAGNOSIS — R928 Other abnormal and inconclusive findings on diagnostic imaging of breast: Secondary | ICD-10-CM

## 2022-11-04 DIAGNOSIS — R921 Mammographic calcification found on diagnostic imaging of breast: Secondary | ICD-10-CM

## 2022-11-07 ENCOUNTER — Ambulatory Visit
Admission: RE | Admit: 2022-11-07 | Discharge: 2022-11-07 | Disposition: A | Discharge status: Home / Self Care | Payer: Medicare HMO | Source: Ambulatory Visit | Attending: Internal Medicine | Admitting: Internal Medicine

## 2022-11-07 DIAGNOSIS — R921 Mammographic calcification found on diagnostic imaging of breast: Secondary | ICD-10-CM | POA: Diagnosis present

## 2022-11-07 DIAGNOSIS — R928 Other abnormal and inconclusive findings on diagnostic imaging of breast: Secondary | ICD-10-CM

## 2022-11-07 HISTORY — PX: BREAST BIOPSY: SHX20

## 2022-11-07 MED ORDER — LIDOCAINE 1 % OPTIME INJ - NO CHARGE
5.0000 mL | Freq: Once | INTRAMUSCULAR | Status: AC
  Administered 2022-11-07: 5 mL
  Filled 2022-11-07: qty 6

## 2022-11-07 MED ORDER — LIDOCAINE-EPINEPHRINE 1 %-1:100000 IJ SOLN
10.0000 mL | Freq: Once | INTRAMUSCULAR | Status: AC
  Administered 2022-11-07: 10 mL
  Filled 2022-11-07: qty 10

## 2022-11-08 LAB — SURGICAL PATHOLOGY

## 2022-11-29 ENCOUNTER — Ambulatory Visit
Admission: RE | Admit: 2022-11-29 | Discharge: 2022-11-29 | Disposition: A | Discharge status: Home / Self Care | Payer: Medicare HMO | Source: Ambulatory Visit | Attending: Internal Medicine

## 2022-11-29 VITALS — BP 139/79 | HR 85 | Temp 99.5°F | Resp 15 | Ht 63.0 in | Wt 209.7 lb

## 2022-11-29 DIAGNOSIS — J069 Acute upper respiratory infection, unspecified: Secondary | ICD-10-CM | POA: Diagnosis not present

## 2022-11-29 DIAGNOSIS — N644 Mastodynia: Secondary | ICD-10-CM

## 2022-11-29 NOTE — Discharge Instructions (Signed)
Please follow-up with your PCP for further workup of your breast pain.  If you develop any worsening symptoms before seeing your PCP please go to the emergency room.  You may use over-the-counter ibuprofen/Tylenol and cough cold treatments as needed.  I hope you feel better soon!

## 2022-11-29 NOTE — ED Triage Notes (Signed)
Patient states that she had a biopsy of her left breast on 11/07/22.  Patient states that for the past 2 weeks she has started to have increase in swelling and pain in her left breast.  Patient reports headache and slight cough that started earlier this week.  Patient unsure of fevers.

## 2022-11-29 NOTE — ED Provider Notes (Signed)
[facility]    CSN: [id number] Arrival date & time: 11/29/22  1338      History   Chief Complaint Chief Complaint  Patient presents with   Breast Problem    left    HPI Alicia Rowe is a 56 y.o. female presents for left breast pain and URI symptoms.  Patient reports 2 to 3 days of congestion, fatigue but denies any fevers, sore throat, cough, body aches, ear pain, shortness of breath.  No asthma history.  She is an active smoker.  In addition she reports on September 26 she had a left breast biopsy.  States she had some bruising underneath the breast due to the position she had to be in during the procedure but not much pain.  Over the past 2 weeks even though the bruising has been fading she has been having worsening breast tenderness and is unable to wear a bra because of this.  Denies any swelling, warmth, rashes, erythema, fevers or chills.  She has been taking Tylenol and naproxen which does temporarily help.  No nipple discharge.  No other concerns at this time.  HPI  Past Medical History:  Diagnosis Date   Anxiety    Complication of anesthesia    Midazolam has "opposite effect" on pt   Depression    Hyperlipidemia    Hypertension    Sleep apnea     Patient Active Problem List   Diagnosis Date Noted   Encounter for screening colonoscopy    Polyp of sigmoid colon     Past Surgical History:  Procedure Laterality Date   APPENDECTOMY     BREAST BIOPSY Left 11/07/2022   affirm bx, ribbon marker, path pending   BREAST BIOPSY Left 11/07/2022   MM LT BREAST BX W LOC DEV 1ST LESION IMAGE BX SPEC STEREO GUIDE 11/07/2022 [facility]   CARPECTOMY HAND     CATARACT EXTRACTION W/PHACO Left 10/13/2019   Procedure: CATARACT EXTRACTION PHACO AND INTRAOCULAR LENS PLACEMENT (IOC) LEFT 9.78 01:13.7 13.3%;  Surgeon: Lockie Mola, MD;  Location: [facility];  Service: Ophthalmology;  Laterality: Left;   CATARACT EXTRACTION W/PHACO Right 11/03/2019    Procedure: CATARACT EXTRACTION PHACO AND INTRAOCULAR LENS PLACEMENT (IOC) RIGHT 4.60 00:46.2 9.9%;  Surgeon: Lockie Mola, MD;  Location: [facility];  Service: Ophthalmology;  Laterality: Right;   COLONOSCOPY WITH PROPOFOL N/A 02/18/2019   Procedure: COLONOSCOPY WITH PROPOFOL;  Surgeon: Midge Minium, MD;  Location: [facility];  Service: Endoscopy;  Laterality: N/A;   DILATION AND CURETTAGE OF UTERUS      OB History   No obstetric history on file.      Home Medications    Prior to Admission medications   Medication Sig Start Date End Date Taking? Authorizing Provider  ALPRAZolam Prudy Feeler) 1 MG tablet Take 1 mg by mouth at bedtime as needed for anxiety.   Yes [provider]  bisoprolol-hydrochlorothiazide (ZIAC) 5-6.25 MG tablet Take 1 tablet by mouth daily.   Yes [provider]  rosuvastatin (CRESTOR) 20 MG tablet Take 20 mg by mouth daily.   Yes [provider]  fluticasone (FLONASE) 50 MCG/ACT nasal spray Place into both nostrils daily.    [provider]  naproxen sodium (ALEVE) 220 MG tablet Take 440 mg by mouth daily as needed.    [provider]  norethindrone-ethinyl estradiol (LOESTRIN FE) 1-20 MG-MCG tablet Take 1 tablet by mouth daily.    [provider]  vortioxetine HBr (TRINTELLIX) 20 MG TABS  tablet Take 20 mg by mouth daily.    [provider]    Family History Family History  Problem Relation Age of Onset   Dementia Mother    Diabetes Father    Breast cancer Maternal Grandfather 17   Breast cancer Paternal Grandmother 73    Social History Social History   Tobacco Use   Smoking status: Every Day    Current packs/day: 0.50    Types: Cigarettes   Smokeless tobacco: Never  Vaping Use   Vaping status: Never Used  Substance Use Topics   Alcohol use: No   Drug use: No     Allergies   Codeine, Midazolam, and Scopolamine   Review of Systems Review of Systems   Constitutional:  Positive for fatigue.  HENT:  Positive for congestion.   Skin:        Left breast pain      Physical Exam Triage Vital Signs ED Triage Vitals  Encounter Vitals Group     BP 11/29/22 1359 139/79     Systolic BP Percentile --      Diastolic BP Percentile --      Pulse Rate 11/29/22 1359 85     Resp 11/29/22 1359 15     Temp 11/29/22 1359 99.5 F (37.5 C)     Temp Source 11/29/22 1359 Oral     SpO2 11/29/22 1359 96 %     Weight 11/29/22 1356 209 lb 10.5 oz (95.1 kg)     Height 11/29/22 1356 5\' 3"  (1.6 m)     Head Circumference --      Peak Flow --      Pain Score 11/29/22 1356 8     Pain Loc --      Pain Education --      Exclude from Growth Chart --    No data found.  Updated Vital Signs BP 139/79 (BP Location: Right Arm)   Pulse 85   Temp 99.5 F (37.5 C) (Oral)   Resp 15   Ht 5\' 3"  (1.6 m)   Wt 209 lb 10.5 oz (95.1 kg)   SpO2 96%   BMI 37.14 kg/m   Visual Acuity Right Eye Distance:   Left Eye Distance:   Bilateral Distance:    Right Eye Near:   Left Eye Near:    Bilateral Near:     Physical Exam Vitals and nursing note reviewed. Exam conducted with a chaperone present.  Constitutional:      General: She is not in acute distress.    Appearance: Normal appearance. She is well-developed. She is not ill-appearing.  HENT:     Head: Normocephalic and atraumatic.     Right Ear: Tympanic membrane and ear canal normal.     Left Ear: Tympanic membrane and ear canal normal.     Nose: Congestion present.     Mouth/Throat:     Mouth: Mucous membranes are moist.     Pharynx: Oropharynx is clear. Uvula midline. No posterior oropharyngeal erythema.     Tonsils: No tonsillar exudate or tonsillar abscesses.  Eyes:     Conjunctiva/sclera: Conjunctivae normal.     Pupils: Pupils are equal, round, and reactive to light.  Cardiovascular:     Rate and Rhythm: Normal rate and regular rhythm.     Heart sounds: Normal heart sounds.  Pulmonary:      Effort: Pulmonary effort is normal.     Breath sounds: Normal breath sounds.  Chest:  Breasts:  Left: Tenderness present. No swelling, bleeding, inverted nipple, mass or nipple discharge.     Comments: There is tenderness with palpation to the entirety of the left breast.  There is no erythema, warmth, rashes.  There are some fading ecchymosis along the underside of the lateral breast.  There is no fluctuance or induration. Musculoskeletal:     Cervical back: Normal range of motion and neck supple.  Lymphadenopathy:     Cervical: No cervical adenopathy.  Skin:    General: Skin is warm and dry.  Neurological:     General: No focal deficit present.     Mental Status: She is alert and oriented to person, place, and time.  Psychiatric:        Mood and Affect: Mood normal.        Behavior: Behavior normal.      UC Treatments / Results  Labs (all labs ordered are listed, but only abnormal results are displayed) Labs Reviewed - No data to display  EKG   Radiology No results found.  Procedures Procedures (including critical care time)  Medications Ordered in UC Medications - No data to display  Initial Impression / Assessment and Plan / UC Course  I have reviewed the triage vital signs and the nursing notes.  Pertinent labs & imaging results that were available during my care of the patient were reviewed by me and considered in my medical decision making (see chart for details).     Reviewed exam and symptoms with patient.  Discussed viral upper respiratory illness.  Declines COVID testing.  Advised symptomatic treatment.  Discussed increasing breast pain status post biopsy 3 weeks ago.  Unclear cause.  Exam does not show any signs of cellulitis or infection.  I advised that she contact her PCP and make them aware of her symptoms to see if additional imaging such as ultrasound would be indicated.  Also advised that she can go to the emergency room as well to be evaluated.  She  states she will call her PCP to see if they can squeeze her in this afternoon.  Reinforced that she does need to go to the emergency room if any worsening symptoms develop prior to her seeing her PCP and she verbalized understanding.  She can continue Tylenol ibuprofen as needed. Final Clinical Impressions(s) / UC Diagnoses   Final diagnoses:  Breast pain, left  Viral URI     Discharge Instructions      Please follow-up with your PCP for further workup of your breast pain.  If you develop any worsening symptoms before seeing your PCP please go to the emergency room.  You may use over-the-counter ibuprofen/Tylenol and cough cold treatments as needed.  I hope you feel better soon!    ED Prescriptions   None    PDMP not reviewed this encounter.   Radford Pax, NP 11/29/22 1431

## 2023-05-13 ENCOUNTER — Other Ambulatory Visit: Payer: Self-pay | Admitting: Internal Medicine

## 2023-05-13 DIAGNOSIS — N644 Mastodynia: Secondary | ICD-10-CM

## 2023-05-13 DIAGNOSIS — R200 Anesthesia of skin: Secondary | ICD-10-CM

## 2023-05-20 ENCOUNTER — Ambulatory Visit
Admission: RE | Admit: 2023-05-20 | Discharge: 2023-05-20 | Disposition: A | Discharge status: Home / Self Care | Source: Ambulatory Visit | Attending: Internal Medicine | Admitting: Internal Medicine

## 2023-05-20 DIAGNOSIS — N644 Mastodynia: Secondary | ICD-10-CM

## 2023-05-20 DIAGNOSIS — R200 Anesthesia of skin: Secondary | ICD-10-CM

## 2023-07-03 ENCOUNTER — Other Ambulatory Visit: Payer: Self-pay | Admitting: Internal Medicine

## 2023-07-03 DIAGNOSIS — Z1231 Encounter for screening mammogram for malignant neoplasm of breast: Secondary | ICD-10-CM

## 2023-11-24 ENCOUNTER — Ambulatory Visit
Admission: RE | Admit: 2023-11-24 | Discharge: 2023-11-24 | Disposition: A | Discharge status: Home / Self Care | Source: Ambulatory Visit | Attending: Internal Medicine | Admitting: Internal Medicine

## 2023-11-24 DIAGNOSIS — Z1231 Encounter for screening mammogram for malignant neoplasm of breast: Secondary | ICD-10-CM | POA: Insufficient documentation
